# Patient Record
Sex: Female | Born: 1935 | Race: White | Hispanic: No | Marital: Single | Smoking: Never smoker
Health system: Southern US, Community
[De-identification: ages and names within clinical notes are randomized; demographics above are authoritative.]

## PROBLEM LIST (undated history)

## (undated) DIAGNOSIS — K7460 Unspecified cirrhosis of liver: Secondary | ICD-10-CM

## (undated) DIAGNOSIS — D649 Anemia, unspecified: Secondary | ICD-10-CM

## (undated) DIAGNOSIS — N2889 Other specified disorders of kidney and ureter: Secondary | ICD-10-CM

## (undated) DIAGNOSIS — I10 Essential (primary) hypertension: Secondary | ICD-10-CM

## (undated) DIAGNOSIS — I351 Nonrheumatic aortic (valve) insufficiency: Secondary | ICD-10-CM

## (undated) DIAGNOSIS — D689 Coagulation defect, unspecified: Secondary | ICD-10-CM

## (undated) DIAGNOSIS — K922 Gastrointestinal hemorrhage, unspecified: Secondary | ICD-10-CM

## (undated) DIAGNOSIS — Z79899 Other long term (current) drug therapy: Secondary | ICD-10-CM

## (undated) DIAGNOSIS — IMO0002 Reserved for concepts with insufficient information to code with codable children: Secondary | ICD-10-CM

## (undated) DIAGNOSIS — I701 Atherosclerosis of renal artery: Secondary | ICD-10-CM

## (undated) DIAGNOSIS — I350 Nonrheumatic aortic (valve) stenosis: Secondary | ICD-10-CM

## (undated) DIAGNOSIS — Z7409 Other reduced mobility: Secondary | ICD-10-CM

## (undated) DIAGNOSIS — T457X1A Poisoning by anticoagulant antagonists, vitamin K and other coagulants, accidental (unintentional), initial encounter: Secondary | ICD-10-CM

## (undated) DIAGNOSIS — N289 Disorder of kidney and ureter, unspecified: Secondary | ICD-10-CM

## (undated) DIAGNOSIS — I340 Nonrheumatic mitral (valve) insufficiency: Secondary | ICD-10-CM

## (undated) DIAGNOSIS — N280 Ischemia and infarction of kidney: Secondary | ICD-10-CM

## (undated) DIAGNOSIS — I4891 Unspecified atrial fibrillation: Secondary | ICD-10-CM

## (undated) DIAGNOSIS — I272 Pulmonary hypertension, unspecified: Secondary | ICD-10-CM

## (undated) DIAGNOSIS — K5790 Diverticulosis of intestine, part unspecified, without perforation or abscess without bleeding: Secondary | ICD-10-CM

## (undated) DIAGNOSIS — I2510 Atherosclerotic heart disease of native coronary artery without angina pectoris: Secondary | ICD-10-CM

## (undated) DIAGNOSIS — R9430 Abnormal result of cardiovascular function study, unspecified: Secondary | ICD-10-CM

## (undated) HISTORY — DX: Atherosclerosis of renal artery: I70.1

## (undated) HISTORY — DX: Unspecified atrial fibrillation: I48.91

## (undated) HISTORY — DX: Atherosclerotic heart disease of native coronary artery without angina pectoris: I25.10

## (undated) HISTORY — DX: Pulmonary hypertension, unspecified: I27.20

## (undated) HISTORY — DX: Nonrheumatic mitral (valve) insufficiency: I34.0

## (undated) HISTORY — DX: Gastrointestinal hemorrhage, unspecified: K92.2

## (undated) HISTORY — DX: Coagulation defect, unspecified: D68.9

## (undated) HISTORY — DX: Other specified disorders of kidney and ureter: N28.89

## (undated) HISTORY — PX: TOTAL ABDOMINAL HYSTERECTOMY: SHX209

## (undated) HISTORY — DX: Diverticulosis of intestine, part unspecified, without perforation or abscess without bleeding: K57.90

## (undated) HISTORY — DX: Ischemia and infarction of kidney: N28.0

## (undated) HISTORY — DX: Anemia, unspecified: D64.9

## (undated) HISTORY — DX: Nonrheumatic aortic (valve) stenosis: I35.0

## (undated) HISTORY — DX: Reserved for concepts with insufficient information to code with codable children: IMO0002

## (undated) HISTORY — DX: Essential (primary) hypertension: I10

## (undated) HISTORY — DX: Unspecified cirrhosis of liver: K74.60

## (undated) HISTORY — DX: Other reduced mobility: Z74.09

## (undated) HISTORY — DX: Disorder of kidney and ureter, unspecified: N28.9

## (undated) HISTORY — DX: Nonrheumatic aortic (valve) insufficiency: I35.1

## (undated) HISTORY — DX: Other long term (current) drug therapy: Z79.899

## (undated) HISTORY — DX: Abnormal result of cardiovascular function study, unspecified: R94.30

## (undated) HISTORY — PX: DILATION AND CURETTAGE OF UTERUS: SHX78

## (undated) HISTORY — DX: Poisoning by anticoagulant antagonists, vitamin k and other coagulants, accidental (unintentional), initial encounter: T45.7X1A

---

## 1997-06-30 ENCOUNTER — Other Ambulatory Visit: Admission: RE | Admit: 1997-06-30 | Discharge: 1997-06-30 | Payer: Self-pay | Admitting: Family Medicine

## 1999-04-20 ENCOUNTER — Other Ambulatory Visit: Admission: RE | Admit: 1999-04-20 | Discharge: 1999-04-20 | Payer: Self-pay | Admitting: *Deleted

## 1999-06-20 ENCOUNTER — Ambulatory Visit (HOSPITAL_COMMUNITY): Admission: RE | Admit: 1999-06-20 | Discharge: 1999-06-20 | Payer: Self-pay | Admitting: *Deleted

## 1999-06-20 ENCOUNTER — Encounter: Payer: Self-pay | Admitting: *Deleted

## 2002-02-12 ENCOUNTER — Emergency Department (HOSPITAL_COMMUNITY): Admission: EM | Admit: 2002-02-12 | Discharge: 2002-02-12 | Payer: Self-pay | Admitting: Emergency Medicine

## 2010-03-27 HISTORY — PX: CARDIAC CATHETERIZATION: SHX172

## 2010-04-01 ENCOUNTER — Inpatient Hospital Stay (HOSPITAL_COMMUNITY): Admission: EM | Admit: 2010-04-01 | Discharge: 2010-04-05 | Payer: Self-pay | Source: Home / Self Care

## 2010-04-01 LAB — COMPREHENSIVE METABOLIC PANEL
ALT: 11 U/L (ref 0–35)
AST: 18 U/L (ref 0–37)
Albumin: 3 g/dL — ABNORMAL LOW (ref 3.5–5.2)
Alkaline Phosphatase: 54 U/L (ref 39–117)
BUN: 32 mg/dL — ABNORMAL HIGH (ref 6–23)
CO2: 25 mEq/L (ref 19–32)
Calcium: 8.8 mg/dL (ref 8.4–10.5)
Chloride: 108 mEq/L (ref 96–112)
Creatinine, Ser: 1.72 mg/dL — ABNORMAL HIGH (ref 0.4–1.2)
GFR calc Af Amer: 34 mL/min — ABNORMAL LOW (ref >60)
GFR calc non Af Amer: 28 mL/min — ABNORMAL LOW (ref >60)
Glucose, Bld: 128 mg/dL — ABNORMAL HIGH (ref 70–99)
Potassium: 4.5 mEq/L (ref 3.5–5.1)
Sodium: 141 mEq/L (ref 135–145)
Total Bilirubin: 0.6 mg/dL (ref 0.3–1.2)
Total Protein: 5.4 g/dL — ABNORMAL LOW (ref 6.0–8.3)

## 2010-04-01 LAB — DIFFERENTIAL
Basophils Absolute: 0 10*3/uL (ref 0.0–0.1)
Basophils Relative: 0 % (ref 0–1)
Eosinophils Absolute: 0.2 10*3/uL (ref 0.0–0.7)
Eosinophils Relative: 2 % (ref 0–5)
Lymphocytes Relative: 7 % — ABNORMAL LOW (ref 12–46)
Lymphs Abs: 0.6 10*3/uL — ABNORMAL LOW (ref 0.7–4.0)
Monocytes Absolute: 0.5 10*3/uL (ref 0.1–1.0)
Monocytes Relative: 6 % (ref 3–12)
Neutro Abs: 7.2 10*3/uL (ref 1.7–7.7)
Neutrophils Relative %: 85 % — ABNORMAL HIGH (ref 43–77)

## 2010-04-01 LAB — URINE MICROSCOPIC-ADD ON

## 2010-04-01 LAB — URINALYSIS, ROUTINE W REFLEX MICROSCOPIC
Hemoglobin, Urine: NEGATIVE
Ketones, ur: 15 mg/dL — AB
Nitrite: NEGATIVE
Protein, ur: 100 mg/dL — AB
Specific Gravity, Urine: 1.022 (ref 1.005–1.030)
Urine Glucose, Fasting: NEGATIVE mg/dL
Urobilinogen, UA: 0.2 mg/dL (ref 0.0–1.0)
pH: 5 (ref 5.0–8.0)

## 2010-04-01 LAB — CBC
HCT: 29.3 % — ABNORMAL LOW (ref 36.0–46.0)
Hemoglobin: 10 g/dL — ABNORMAL LOW (ref 12.0–15.0)
MCH: 32.6 pg (ref 26.0–34.0)
MCHC: 34.1 g/dL (ref 30.0–36.0)
MCV: 95.4 fL (ref 78.0–100.0)
Platelets: 145 10*3/uL — ABNORMAL LOW (ref 150–400)
RBC: 3.07 MIL/uL — ABNORMAL LOW (ref 3.87–5.11)
RDW: 13.3 % (ref 11.5–15.5)
WBC: 8.5 10*3/uL (ref 4.0–10.5)

## 2010-04-01 LAB — CK TOTAL AND CKMB (NOT AT ARMC)
CK, MB: 2.6 ng/mL (ref 0.3–4.0)
Relative Index: INVALID (ref 0.0–2.5)
Total CK: 57 U/L (ref 7–177)

## 2010-04-01 LAB — LIPASE, BLOOD: Lipase: 26 U/L (ref 11–59)

## 2010-04-01 LAB — LACTIC ACID, PLASMA: Lactic Acid, Venous: 2 mmol/L (ref 0.5–2.2)

## 2010-04-01 LAB — GLUCOSE, CAPILLARY: Glucose-Capillary: 119 mg/dL — ABNORMAL HIGH (ref 70–99)

## 2010-04-01 LAB — TROPONIN I: Troponin I: 0.2 ng/mL — ABNORMAL HIGH (ref 0.00–0.06)

## 2010-04-01 LAB — PROCALCITONIN: Procalcitonin: <0.1 ng/mL

## 2010-04-02 ENCOUNTER — Encounter: Payer: Self-pay | Admitting: Cardiology

## 2010-04-11 LAB — BASIC METABOLIC PANEL
BUN: 28 mg/dL — ABNORMAL HIGH (ref 6–23)
BUN: 17 mg/dL (ref 6–23)
CO2: 22 mEq/L (ref 19–32)
CO2: 20 mEq/L (ref 19–32)
CO2: 21 mEq/L (ref 19–32)
Calcium: 8.6 mg/dL (ref 8.4–10.5)
Calcium: 8.5 mg/dL (ref 8.4–10.5)
Calcium: 8.6 mg/dL (ref 8.4–10.5)
Chloride: 112 mEq/L (ref 96–112)
Chloride: 112 mEq/L (ref 96–112)
Creatinine, Ser: 1.11 mg/dL (ref 0.4–1.2)
Creatinine, Ser: 1 mg/dL (ref 0.4–1.2)
Creatinine, Ser: 0.92 mg/dL (ref 0.4–1.2)
GFR calc Af Amer: 56 mL/min — ABNORMAL LOW (ref >60)
GFR calc Af Amer: >60 mL/min (ref >60)
GFR calc Af Amer: >60 mL/min (ref >60)
GFR calc non Af Amer: 46 mL/min — ABNORMAL LOW (ref >60)
Glucose, Bld: 109 mg/dL — ABNORMAL HIGH (ref 70–99)
Glucose, Bld: 111 mg/dL — ABNORMAL HIGH (ref 70–99)
Glucose, Bld: 115 mg/dL — ABNORMAL HIGH (ref 70–99)
Potassium: 3.9 mEq/L (ref 3.5–5.1)
Potassium: 3.7 mEq/L (ref 3.5–5.1)
Potassium: 3.9 mEq/L (ref 3.5–5.1)
Sodium: 140 mEq/L (ref 135–145)
Sodium: 139 mEq/L (ref 135–145)
Sodium: 138 mEq/L (ref 135–145)

## 2010-04-11 LAB — HEPARIN LEVEL (UNFRACTIONATED)
Heparin Unfractionated: 0.22 IU/mL — ABNORMAL LOW (ref 0.30–0.70)
Heparin Unfractionated: <0.1 IU/mL — ABNORMAL LOW (ref 0.30–0.70)
Heparin Unfractionated: <0.1 IU/mL — ABNORMAL LOW (ref 0.30–0.70)
Heparin Unfractionated: 0.38 IU/mL (ref 0.30–0.70)
Heparin Unfractionated: 0.5 IU/mL (ref 0.30–0.70)

## 2010-04-11 LAB — CBC
HCT: 25.2 % — ABNORMAL LOW (ref 36.0–46.0)
HCT: 25.6 % — ABNORMAL LOW (ref 36.0–46.0)
HCT: 26.1 % — ABNORMAL LOW (ref 36.0–46.0)
HCT: 27.9 % — ABNORMAL LOW (ref 36.0–46.0)
Hemoglobin: 8.7 g/dL — ABNORMAL LOW (ref 12.0–15.0)
Hemoglobin: 8.7 g/dL — ABNORMAL LOW (ref 12.0–15.0)
Hemoglobin: 9 g/dL — ABNORMAL LOW (ref 12.0–15.0)
Hemoglobin: 9.6 g/dL — ABNORMAL LOW (ref 12.0–15.0)
MCH: 32.6 pg (ref 26.0–34.0)
MCH: 32.2 pg (ref 26.0–34.0)
MCH: 32.3 pg (ref 26.0–34.0)
MCHC: 34.5 g/dL (ref 30.0–36.0)
MCHC: 34 g/dL (ref 30.0–36.0)
MCHC: 34.4 g/dL (ref 30.0–36.0)
MCHC: 34.5 g/dL (ref 30.0–36.0)
MCV: 94.4 fL (ref 78.0–100.0)
MCV: 94.8 fL (ref 78.0–100.0)
MCV: 93.5 fL (ref 78.0–100.0)
Platelets: 124 10*3/uL — ABNORMAL LOW (ref 150–400)
Platelets: 125 10*3/uL — ABNORMAL LOW (ref 150–400)
Platelets: 122 10*3/uL — ABNORMAL LOW (ref 150–400)
Platelets: 149 10*3/uL — ABNORMAL LOW (ref 150–400)
RBC: 2.67 MIL/uL — ABNORMAL LOW (ref 3.87–5.11)
RBC: 2.7 MIL/uL — ABNORMAL LOW (ref 3.87–5.11)
RBC: 2.79 MIL/uL — ABNORMAL LOW (ref 3.87–5.11)
RBC: 2.95 MIL/uL — ABNORMAL LOW (ref 3.87–5.11)
RDW: 13.2 % (ref 11.5–15.5)
RDW: 13.1 % (ref 11.5–15.5)
RDW: 12.9 % (ref 11.5–15.5)
WBC: 5.5 10*3/uL (ref 4.0–10.5)
WBC: 6.2 10*3/uL (ref 4.0–10.5)
WBC: 5.6 10*3/uL (ref 4.0–10.5)
WBC: 8.2 10*3/uL (ref 4.0–10.5)

## 2010-04-11 LAB — DIFFERENTIAL
Basophils Absolute: 0 10*3/uL (ref 0.0–0.1)
Basophils Relative: 1 % (ref 0–1)
Eosinophils Absolute: 0.5 10*3/uL (ref 0.0–0.7)
Eosinophils Relative: 9 % — ABNORMAL HIGH (ref 0–5)
Lymphocytes Relative: 13 % (ref 12–46)
Lymphs Abs: 0.8 10*3/uL (ref 0.7–4.0)
Monocytes Absolute: 0.6 10*3/uL (ref 0.1–1.0)
Monocytes Relative: 9 % (ref 3–12)
Neutro Abs: 4.3 10*3/uL (ref 1.7–7.7)
Neutrophils Relative %: 69 % (ref 43–77)

## 2010-04-11 LAB — CULTURE, BLOOD (ROUTINE X 2)
Culture  Setup Time: 201201062059
Culture  Setup Time: 201201062059
Culture: NO GROWTH
Culture: NO GROWTH

## 2010-04-11 LAB — CARDIAC PANEL(CRET KIN+CKTOT+MB+TROPI)
CK, MB: 5.6 ng/mL — ABNORMAL HIGH (ref 0.3–4.0)
CK, MB: 6.5 ng/mL (ref 0.3–4.0)
Relative Index: INVALID (ref 0.0–2.5)
Relative Index: INVALID (ref 0.0–2.5)
Total CK: 71 U/L (ref 7–177)
Total CK: 84 U/L (ref 7–177)
Troponin I: 0.88 ng/mL (ref 0.00–0.06)
Troponin I: 0.91 ng/mL (ref 0.00–0.06)

## 2010-04-11 LAB — PROTIME-INR
INR: 1.16 (ref 0.00–1.49)
Prothrombin Time: 15 seconds (ref 11.6–15.2)

## 2010-04-11 LAB — APTT: aPTT: 25 seconds (ref 24–37)

## 2010-04-11 LAB — TSH: TSH: 2.533 u[IU]/mL (ref 0.350–4.500)

## 2010-04-16 NOTE — Consult Note (Signed)
[name], [name] NO.:  [id number]  MEDICAL RECORD NO.:  [id number]          PATIENT TYPE:  INP  LOCATION:  4706                         FACILITY:  [facility]  PHYSICIAN:  Luis Abed, MD, FACCDATE OF BIRTH:  1918/09/19  DATE OF CONSULTATION:  04/01/2010 DATE OF DISCHARGE:                                CONSULTATION   PRIMARY CARE PHYSICIAN:  Dr. Brent Bulla in [place].  PRIMARY CARDIOLOGIST:  None.  CHIEF COMPLAINT:  Chest pain and abnormal cardiac enzymes.  HISTORY OF PRESENT ILLNESS:  [name] is an 77 year old female with no previous history of coronary artery disease.  She reports an approximately 5-day history of upper left chest pain.  She also had some general malaise and a mild cough.  She denies fevers or chills.  She had some nasal congestion as well.  The left-sided chest pain reached 8/10 at times.  It would come and go and seemed to be brought on or made worse by certain movements or lifting anything.  She felt that her chest wall was tender at one point on March 30, 2010.  She went to her family physician and received antibiotics for a possible upper respiratory infection.  He planned to see her again today.  This a.m., she woke up, but was unable to get out of bed because of weakness.  She was still having some chest pain.  Her chest wall was no longer tender.  Her family came over because they had been planning to take her to the doctor.  When they got there, they helped her to a wheelchair.  She was unable to walk unaided, so they called her physician, who recommended EMS transport to the hospital.  The patient does not remember being given any medications in route to the hospital.  Currently in the emergency room, she is resting comfortably on O2 and with IV fluids going.  She has never had symptoms like this before.  She is generally active and states she was moving boxes more so than usual this week, but not lifting anything that  was heavier than usual for her.  In the emergency room, her cardiac enzymes were checked and her troponin was elevated, so Cardiology was asked to evaluate her.  PAST MEDICAL HISTORY: 1. Hypertension. 2. Family history of coronary artery disease. 3. History of diverticulosis seen on CT remotely.  SURGICAL HISTORY:  She is status post hysterectomy, which is the only time she had been in the hospital.  ALLERGIES:  She is allergic or intolerant to PENICILLIN, NIACIN, and SULFA.  CURRENT MEDICATIONS: 1. Xanax 0.5 mg p.r.n. 2. Labetalol 400 mg a day. 3. Levaquin 500 mg a day since March 30, 2010. 4. Lisinopril/hydrochlorothiazide 20/12.5 daily. 5. Ambien 5 mg a day. 6. Vitamin D2 50,000 units a week. 7. Mobic 7.5 mg b.i.d.  SOCIAL HISTORY:  She lives in [place] alone.  She has some one that comes to help her and stays with her at times at night.  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  She has no history of alcohol, tobacco,  or drug abuse.  FAMILY HISTORY:  Her mother died at 42 with cancer, but not heart disease and her father died in his 27s with no heart disease, but one brother had a heart attack and died.  REVIEW OF SYSTEMS:  She has had nasal congestion, but no purulent discharge.  There has been no fevers or chills.  She had dyspnea on exertion, but denies orthopnea, PND, edema or palpitations.  She has not had shortness of breath until today.  The left chest pain is described as an aching.  The weakness is today.  She has some chronic arthralgias and joint pains.  Full 14-point review of systems is otherwise negative except as stated in the HPI.  PHYSICAL EXAMINATION:  VITAL SIGNS:  Temperature is 98.0, blood pressure initially 96/47, now 112/47, heart rate 91, respiratory rate 18, O2 saturation 98% on 3 liters. GENERAL:  She is a well-developed elderly white female in no acute distress. HEENT:  Normal. NECK:  There is no lymphadenopathy,  thyromegaly, bruits or JVD noted. CV:  Her heart is regular in rate and rhythm with an S1 and S2, and no significant murmur, rub, or gallop is noted.  Distal pulses are intact in all 4 extremities. LUNGS:  She has a few rales in the bases, but they are generally clear. SKIN:  No rashes or lesions are noted. ABDOMEN:  Soft and nontender with active bowel sounds. EXTREMITIES:  There is no cyanosis, clubbing or edema noted. MUSCULOSKELETAL:  There is no joint deformity or effusions, and no chest wall tenderness as well as no spine or CVA tenderness. NEUROLOGIC:  She is alert and oriented with cranial nerves II through XII grossly intact.  Chest x-ray:  No acute disease.  EKG:  Sinus rhythm, rate 81 with no acute ischemic changes.  Initial rhythm strip on EMS shows ST elevation in lead III (leads II, III, and aVF are the only one shown).  Her initial EKG by EMS showed a heart rate of 131 and is possibly atrial fibrillation.  In lead aVL, T-wave inversions are seen that are deeper and the T-wave inversions seen on the ER ECG which is clearly sinus.  LABORATORY VALUES:  Hemoglobin 10.0, hematocrit 29.3, WBCs 8.5, platelets 145.  Sodium 141, potassium 4.5, chloride 108, CO2 of 25, BUN 32, creatinine 1.72, glucose 128, lipase 26.  Urinalysis shows a small amount of leukocyte esterase and rare bacteria.  Lactic acid 2.0.  Pro calcitonin less than 10.  Fecal occult blood negative.  Troponin I of 0.20, CK-MB 57/2.6.  Initial point-of-care markers negative x1.  IMPRESSION:  [name] was seen today by Dr. Myrtis Ser, the patient evaluated and the data reviewed.  It is difficult to know exactly what the primary problem was.  There seemed to be pain with the movement of her left arm for 5 days.  Then, weakness did not develop until today.  No EKG was done until today, so we have no information on whether or not her rhythm was abnormal prior to today.  Currently, she feels well and her initial point of  care markers as well as her CK-MB were negative, but her troponin was elevated at 0.2.  PLAN: 1. Continue to cycle enzymes. 2. Check a 2-D echocardiogram. 3. If cardiac enzymes are consistent with injury or if her left     ventricle is abnormal, we will be aggressive with evaluation as she     is quite active.  If the cardiac enzymes do not indicate  injury, we     will follow her medically as long as her EF is okay.  Additionally,     she will be monitored closely for recurrence of atrial     fibrillation.  She has already been placed on heparin.     Consideration can be given to Coumadin, with the final decision to     be made once all data are in.  A beta-blocker will be added as her     blood pressure will allow, but currently, we will hold off.     Kimberly Demark, PA-C   ______________________________ Luis Abed, MD, [facility]    RB/MEDQ  D:  04/01/2010  T:  04/02/2010  Job:  [id number]  Electronically Signed by Kimberly Demark PA-C on 04/15/2010 04:30:09 PM Electronically Signed by Willa Rough MD FACC on 04/16/2010 09:10:26 AM

## 2010-04-17 NOTE — Procedures (Signed)
[name], [name] NO.:  [id number]  MEDICAL RECORD NO.:  [id number]          PATIENT TYPE:  INP  LOCATION:  6526                         FACILITY:  [facility]  PHYSICIAN:  [name], MDDATE OF BIRTH:  1918/05/17  DATE OF PROCEDURE:  04/04/2010 DATE OF DISCHARGE:                           CARDIAC CATHETERIZATION   PRIMARY CARE PHYSICIAN:  Dr. Brent [name] in [place].  PATIENT IDENTIFICATION:  [name] is a delightful 77 year old woman who was very functional, no history of coronary artery disease.  She was admitted with left shoulder pain and ended up ruling in for a non-ST elevation myocardial infarction.  She was also found to be in atrial fibrillation.  She is referred for cardiac catheterization.  PROCEDURES PERFORMED: 1. Selective coronary angiography. 2. Left heart catheterization. 3. Left ventriculogram.  DESCRIPTION OF PROCEDURE:  The risks and indications were explained. Informed consent was signed and placed on the chart.  A 5-French arterial sheath was placed in the right femoral artery.  Using modified Seldinger technique, standard catheters including JL-4, JR-4, and angled pigtail were used.  All catheter exchanges were made over wire.  There were no apparent complications.  Central aortic pressure 151/75 with a mean of 107.  LV pressure 149/10 with an EDP of 22.  There is no aortic stenosis.  Left main had just mild luminal irregularities, otherwise normal.  LAD was a moderate-sized vessel.  It gave off a tiny first diagonal and a small-to-moderate second diagonal.  There was a 30% lesion in the mid- LAD and 80% lesion in the mid second diagonal.  Left circumflex gave off a moderate-to-large ramus branch and a small OM- 1.  In the proximal AV groove circ, there was an 80% to 90% focal stenosis.  In the midportion of the ramus, there was an 80% to 90% focal stenosis.  The right coronary artery was a very large dominant vessel  probably about 5.0 mm in diameter.  It gave off a RV branch, a large PDA, and several large posterolaterals feeding most of the lateral wall. Proximal RCA had diffuse 30% stenosis.  In the PDA, there was diffuse 30% stenosis and there was focal 40% stenosis in the first posterolateral.  Left ventriculogram done in the RAO position.  EF was hard to discern due to ectopy, but ejection fraction was probably 60% to 65% with no regional wall motion abnormalities.  ASSESSMENT: 1. Coronary artery disease with high-grade lesions in the left     circumflex, ramus, and second diagonal branch. 2. Normal left ventricular function.  PLAN/DISCUSSION:  I reviewed the catheterization films with Dr. [place]. Given the fact that she has 3 lesions, which are all about the same severity, it is a bit hard to know which is the culprit.  We will proceed with percutaneous intervention on the left circumflex and ramus branch.  Given the size of the diagonal, we have opted to treat that medically for now.  We will see how she does.     Kimberly Buckles. Bensimhon, MD     [name]/MEDQ  D:  04/04/2010  T:  04/05/2010  Job:  [id number]  cc:   Kimberly Bulla, MD  Electronically Signed by Kimberly Meres MD on 04/17/2010 07:12:58 PM

## 2010-04-18 ENCOUNTER — Encounter: Payer: Self-pay | Admitting: Cardiology

## 2010-04-19 ENCOUNTER — Ambulatory Visit
Admission: RE | Admit: 2010-04-19 | Discharge: 2010-04-19 | Payer: Self-pay | Source: Home / Self Care | Attending: Cardiology | Admitting: Cardiology

## 2010-04-19 ENCOUNTER — Encounter: Payer: Self-pay | Admitting: Cardiology

## 2010-04-21 LAB — URINE CULTURE
Colony Count: 60000
Culture  Setup Time: 201201061740

## 2010-04-21 LAB — OCCULT BLOOD, POC DEVICE: Fecal Occult Bld: NEGATIVE

## 2010-04-21 NOTE — Discharge Summary (Addendum)
[name], [name] NO.:  [id number]  MEDICAL RECORD NO.:  [id number]          PATIENT TYPE:  INP  LOCATION:  6526                         FACILITY:  [facility]  PHYSICIAN:  [name], MDDATE OF BIRTH:  13-Dec-1918  DATE OF ADMISSION:  04/01/2010 DATE OF DISCHARGE:  04/05/2010                              DISCHARGE SUMMARY   PRIMARY CARDIOLOGIST:  Kimberly Abed, MD, [facility]  PRIMARY CARE PROVIDER:  Dr. Brent [name] in [place], [place].  DISCHARGE DIAGNOSIS:  Non-ST-segment elevation myocardial infarction.  SECONDARY DIAGNOSES: 1. Coronary artery disease status post successful percutaneous     coronary intervention and bare-metal stenting to the ramus     intermedius and proximal left circumflex this admission. 2. Newly-diagnosed atrial fibrillation, currently rate controlled. 3. Hypertension. 4. History of diverticulosis. 5. Status post hysterectomy.  ALLERGIES:  PENICILLIN, NIACIN, SULFA.  PROCEDURES:  A 2-D echocardiogram April 02, 2010, EF 60-65%, normal wall motion.  Moderate aortic stenosis and mild aortic regurgitation. Mild mitral regurgitation with severely calcified annulus.  Mildly dilated left atrium.  PASP of 67 mmHg.  Trivial pericardial effusion.  HISTORY OF PRESENT ILLNESS:  An 77 year old female without prior cardiac history presented to the [facility] ED on April 01, 2010, with a 5-day history of left upper chest discomfort with some chest wall tenderness. Pain was also sometimes reproducible with certain lifting movements.  On the morning of admission, the patient was severely weak and was unable to get out of bed and also complained of chest discomfort.  She was taken to the [facility] ED where she was found to be in atrial fibrillation initially at a rate of 131.  Followup ECG showed sinus rhythm.  The patient admitted for further evaluation.  HOSPITAL COURSE:  [name] ruled in for non-ST-segment elevation  MI eventually peaking her CK at 84, MB at 6.5, and troponin-I at 0.91.  She was maintained on beta-blocker therapy and was also heparinized. Decision was made that the patient would require a cardiac catheterization and she was agreeable.  She did have recurrence of atrial fibrillation starting on January 7 and this was subsequently persisted.  She has been rate controlled with IV diltiazem which we have converted to oral diltiazem today and she was also maintained on the oral beta-blocker therapy.  A 2-D echocardiogram performed this admission showed normal LV function with moderate aortic stenosis and mild aortic insufficiency.  She also had mild mitral regurgitation.  Followup echo was recommended with plan for conservative management.  The patient finally underwent diagnostic catheterization on January 9 revealing an 80-90% stenosis in the distal ramus intermedius as well as an 80-90% stenosis in the proximal left circumflex.  She also had an 80% stenosis in small second diagonal and otherwise nonobstructive disease. EF was 60-65%.  Films were reviewed and was felt that she would benefit from PCI of both the circumflex and ramus intermedius.  This was carried out on the afternoon of January 9 with placement of a 2.5 x 12-mm mini Vision bare-metal stent in the ramus intermedius along with a 2.5 x 12- mm  mini Vision bare metal stent in the left circumflex.  The patient tolerated this procedure well and postprocedure, she has been ambulating without difficulty.  She has remained in atrial fibrillation and we have converted her from IV to p.o. diltiazem with heart rates current in the 80 range.  The patient is felt to be ready for discharge today.  Of note, she had an urinalysis on admission which showed 0-2 wbcs, rare bacteria, small leukocytes and negative nitrite.  Urine culture showed enterococcus species as well as coag negative Staphylococcus species. The patient has been  asymptomatic as well as afebrile with normal white blood count throughout admission.  We curb sided infectious disease and they felt that growth of both organisms were likely contaminant and therefore the patient is not going to be treated with antibiotics.  DISCHARGE LABORATORY FINDINGS:  Hemoglobin 9.0, hematocrit 26.1, WBC 8.2, platelets 149.  Sodium 138, potassium 3.9, chloride 112, CO2 21, BUN 17, creatinine 0.92, glucose 115, total bilirubin 0.6, alkaline phosphatase 54, AST 18, ALT 11, total protein 5.4, albumin 3.0, calcium 8.6, procalcitonin less than 0.10, lactic acid 2.0, lipase 26, CK 84, MB 6.5, troponin-I 0.91.  TSH 2.533.  Fecal occult blood was negative.  DISPOSITION:  The patient will be discharged home today in good condition.  FOLLOWUP PLANS AND APPOINTMENTS:  We will arrange to follow up with [name] on January 24, at 11:00 a.m.  Follow up with [name] as previously scheduled.  DISCHARGE MEDICATIONS: 1. Aspirin 325 mg daily. 2. Plavix 75 mg daily. 3. Diltiazem CD 180 mg daily. 4. Lipitor 10 mg nightly. 5. Metoprolol 50 mg b.i.d. 6. Nitroglycerin 0.4 subcu p.r.n. chest pain. 7. Alprazolam 0.5 mg 1 tablet t.i.d. p.r.n. 8. Vitamin D 50,000 units every week. 9. Zolpidem 5 mg 1 tablet nightly p.r.n.  Of note, we have not initiated Coumadin therapy at this time given acute coronary artery syndrome with requirement for aspirin and Plavix in the setting of advanced age of 42.  That said, we will reconsider Coumadin therapy after at least 1 month of Plavix and also consider cardioversion if we cannot adequately rate control atrial fibrillation or patient experiences symptoms in the future.  OUTSTANDING LABORATORY STUDIES:  The patient will require followup lipids and LFTs in approximately 6-8 weeks given new statin therapy.  DURATION DISCHARGE ENCOUNTER:  60 minutes including physician time.     [name],  ANP   ______________________________ Kimberly Buckles. Bensimhon, MD    CB/MEDQ  D:  04/05/2010  T:  04/06/2010  Job:  [id number]  cc:   Brent [name]  Electronically Signed by [name] ANP on 04/18/2010 03:56:57 PM Electronically Signed by Arvilla Meres MD on 04/21/2010 04:03:46 PM

## 2010-04-22 ENCOUNTER — Ambulatory Visit: Admit: 2010-04-22 | Payer: Self-pay

## 2010-04-23 ENCOUNTER — Inpatient Hospital Stay (HOSPITAL_COMMUNITY)
Admission: EM | Admit: 2010-04-23 | Discharge: 2010-04-29 | DRG: 699 | Disposition: A | Discharge status: Home / Self Care | Payer: Medicare Other | Attending: Cardiology | Admitting: Cardiology

## 2010-04-23 DIAGNOSIS — N2889 Other specified disorders of kidney and ureter: Principal | ICD-10-CM | POA: Diagnosis present

## 2010-04-23 DIAGNOSIS — I359 Nonrheumatic aortic valve disorder, unspecified: Secondary | ICD-10-CM | POA: Diagnosis present

## 2010-04-23 DIAGNOSIS — I214 Non-ST elevation (NSTEMI) myocardial infarction: Secondary | ICD-10-CM | POA: Diagnosis present

## 2010-04-23 DIAGNOSIS — K7460 Unspecified cirrhosis of liver: Secondary | ICD-10-CM | POA: Diagnosis present

## 2010-04-23 DIAGNOSIS — I509 Heart failure, unspecified: Secondary | ICD-10-CM | POA: Diagnosis present

## 2010-04-23 DIAGNOSIS — I2510 Atherosclerotic heart disease of native coronary artery without angina pectoris: Secondary | ICD-10-CM | POA: Diagnosis present

## 2010-04-23 DIAGNOSIS — I319 Disease of pericardium, unspecified: Secondary | ICD-10-CM | POA: Diagnosis present

## 2010-04-23 DIAGNOSIS — N289 Disorder of kidney and ureter, unspecified: Secondary | ICD-10-CM | POA: Diagnosis present

## 2010-04-23 DIAGNOSIS — I701 Atherosclerosis of renal artery: Secondary | ICD-10-CM | POA: Diagnosis present

## 2010-04-23 DIAGNOSIS — I5030 Unspecified diastolic (congestive) heart failure: Secondary | ICD-10-CM | POA: Diagnosis present

## 2010-04-23 DIAGNOSIS — I4891 Unspecified atrial fibrillation: Secondary | ICD-10-CM | POA: Diagnosis present

## 2010-04-23 DIAGNOSIS — D649 Anemia, unspecified: Secondary | ICD-10-CM | POA: Diagnosis present

## 2010-04-23 DIAGNOSIS — I2789 Other specified pulmonary heart diseases: Secondary | ICD-10-CM | POA: Diagnosis present

## 2010-04-23 DIAGNOSIS — I10 Essential (primary) hypertension: Secondary | ICD-10-CM | POA: Diagnosis present

## 2010-04-23 DIAGNOSIS — I259 Chronic ischemic heart disease, unspecified: Secondary | ICD-10-CM | POA: Diagnosis present

## 2010-04-23 DIAGNOSIS — I059 Rheumatic mitral valve disease, unspecified: Secondary | ICD-10-CM | POA: Diagnosis present

## 2010-04-23 DIAGNOSIS — J90 Pleural effusion, not elsewhere classified: Secondary | ICD-10-CM | POA: Diagnosis present

## 2010-04-23 LAB — COMPREHENSIVE METABOLIC PANEL
ALT: 27 U/L (ref 0–35)
AST: 54 U/L — ABNORMAL HIGH (ref 0–37)
Albumin: 3.7 g/dL (ref 3.5–5.2)
Alkaline Phosphatase: 65 U/L (ref 39–117)
BUN: 21 mg/dL (ref 6–23)
CO2: 23 mEq/L (ref 19–32)
Calcium: 8.7 mg/dL (ref 8.4–10.5)
Chloride: 105 mEq/L (ref 96–112)
Creatinine, Ser: 1.39 mg/dL — ABNORMAL HIGH (ref 0.4–1.2)
GFR calc Af Amer: 43 mL/min — ABNORMAL LOW (ref >60)
GFR calc non Af Amer: 36 mL/min — ABNORMAL LOW (ref >60)
Glucose, Bld: 103 mg/dL — ABNORMAL HIGH (ref 70–99)
Potassium: 4.2 mEq/L (ref 3.5–5.1)
Sodium: 134 mEq/L — ABNORMAL LOW (ref 135–145)
Total Bilirubin: 0.7 mg/dL (ref 0.3–1.2)
Total Protein: 6.2 g/dL (ref 6.0–8.3)

## 2010-04-23 LAB — URINALYSIS, ROUTINE W REFLEX MICROSCOPIC
Bilirubin Urine: NEGATIVE
Hgb urine dipstick: NEGATIVE
Ketones, ur: NEGATIVE mg/dL
Leukocytes, UA: NEGATIVE
Nitrite: NEGATIVE
Protein, ur: 30 mg/dL — AB
Specific Gravity, Urine: 1.017 (ref 1.005–1.030)
Urine Glucose, Fasting: NEGATIVE mg/dL
Urobilinogen, UA: 0.2 mg/dL (ref 0.0–1.0)
pH: 6.5 (ref 5.0–8.0)

## 2010-04-23 LAB — POCT CARDIAC MARKERS
CKMB, poc: 1 ng/mL (ref 1.0–8.0)
Myoglobin, poc: 97.6 ng/mL (ref 12–200)
Troponin i, poc: <0.05 ng/mL (ref 0.00–0.09)

## 2010-04-23 LAB — CBC
HCT: 30.2 % — ABNORMAL LOW (ref 36.0–46.0)
Hemoglobin: 10.1 g/dL — ABNORMAL LOW (ref 12.0–15.0)
MCH: 31.6 pg (ref 26.0–34.0)
MCHC: 33.4 g/dL (ref 30.0–36.0)
MCV: 94.4 fL (ref 78.0–100.0)
Platelets: 146 10*3/uL — ABNORMAL LOW (ref 150–400)
RBC: 3.2 MIL/uL — ABNORMAL LOW (ref 3.87–5.11)
RDW: 14.9 % (ref 11.5–15.5)
WBC: 10.2 10*3/uL (ref 4.0–10.5)

## 2010-04-23 LAB — URINE MICROSCOPIC-ADD ON

## 2010-04-23 LAB — PROTIME-INR
INR: 1.36 (ref 0.00–1.49)
Prothrombin Time: 17 seconds — ABNORMAL HIGH (ref 11.6–15.2)

## 2010-04-23 LAB — BRAIN NATRIURETIC PEPTIDE: Pro B Natriuretic peptide (BNP): 942 pg/mL — ABNORMAL HIGH (ref 0.0–100.0)

## 2010-04-23 LAB — DIFFERENTIAL
Basophils Absolute: 0 10*3/uL (ref 0.0–0.1)
Basophils Relative: 0 % (ref 0–1)
Eosinophils Absolute: 0.5 10*3/uL (ref 0.0–0.7)
Eosinophils Relative: 4 % (ref 0–5)
Lymphocytes Relative: 7 % — ABNORMAL LOW (ref 12–46)
Lymphs Abs: 0.7 10*3/uL (ref 0.7–4.0)
Monocytes Absolute: 0.7 10*3/uL (ref 0.1–1.0)
Monocytes Relative: 7 % (ref 3–12)
Neutro Abs: 8.3 10*3/uL — ABNORMAL HIGH (ref 1.7–7.7)
Neutrophils Relative %: 82 % — ABNORMAL HIGH (ref 43–77)

## 2010-04-23 LAB — LIPASE, BLOOD: Lipase: 41 U/L (ref 11–59)

## 2010-04-23 LAB — D-DIMER, QUANTITATIVE: D-Dimer, Quant: 1.88 ug/mL-FEU — ABNORMAL HIGH (ref 0.00–0.48)

## 2010-04-23 LAB — APTT: aPTT: 37 seconds (ref 24–37)

## 2010-04-24 LAB — CARDIAC PANEL(CRET KIN+CKTOT+MB+TROPI)
CK, MB: 1.7 ng/mL (ref 0.3–4.0)
Relative Index: INVALID (ref 0.0–2.5)
Total CK: 88 U/L (ref 7–177)
Troponin I: 0.02 ng/mL (ref 0.00–0.06)

## 2010-04-24 LAB — COMPREHENSIVE METABOLIC PANEL
ALT: 42 U/L — ABNORMAL HIGH (ref 0–35)
AST: 50 U/L — ABNORMAL HIGH (ref 0–37)
Albumin: 3 g/dL — ABNORMAL LOW (ref 3.5–5.2)
Alkaline Phosphatase: 50 U/L (ref 39–117)
BUN: 18 mg/dL (ref 6–23)
CO2: 22 mEq/L (ref 19–32)
Calcium: 8.5 mg/dL (ref 8.4–10.5)
Chloride: 102 mEq/L (ref 96–112)
Creatinine, Ser: 1.64 mg/dL — ABNORMAL HIGH (ref 0.4–1.2)
GFR calc Af Amer: 36 mL/min — ABNORMAL LOW (ref >60)
GFR calc non Af Amer: 30 mL/min — ABNORMAL LOW (ref >60)
Glucose, Bld: 112 mg/dL — ABNORMAL HIGH (ref 70–99)
Potassium: 4.2 mEq/L (ref 3.5–5.1)
Sodium: 132 mEq/L — ABNORMAL LOW (ref 135–145)
Total Bilirubin: 0.8 mg/dL (ref 0.3–1.2)
Total Protein: 5 g/dL — ABNORMAL LOW (ref 6.0–8.3)

## 2010-04-24 LAB — CBC
HCT: 27.4 % — ABNORMAL LOW (ref 36.0–46.0)
Hemoglobin: 9.5 g/dL — ABNORMAL LOW (ref 12.0–15.0)
MCH: 32.3 pg (ref 26.0–34.0)
MCHC: 34.7 g/dL (ref 30.0–36.0)
MCV: 93.2 fL (ref 78.0–100.0)
Platelets: 132 10*3/uL — ABNORMAL LOW (ref 150–400)
RBC: 2.94 MIL/uL — ABNORMAL LOW (ref 3.87–5.11)
RDW: 15 % (ref 11.5–15.5)
WBC: 11.6 10*3/uL — ABNORMAL HIGH (ref 4.0–10.5)

## 2010-04-24 LAB — HEPARIN LEVEL (UNFRACTIONATED): Heparin Unfractionated: 0.39 IU/mL (ref 0.30–0.70)

## 2010-04-25 LAB — CBC
MCH: 32 pg (ref 26.0–34.0)
MCHC: 34.3 g/dL (ref 30.0–36.0)
Platelets: 118 10*3/uL — ABNORMAL LOW (ref 150–400)
RDW: 15.2 % (ref 11.5–15.5)

## 2010-04-25 LAB — HEPARIN LEVEL (UNFRACTIONATED)
Heparin Unfractionated: 0.1 IU/mL — ABNORMAL LOW (ref 0.30–0.70)
Heparin Unfractionated: 1.14 IU/mL — ABNORMAL HIGH (ref 0.30–0.70)
Heparin Unfractionated: 0.51 IU/mL (ref 0.30–0.70)

## 2010-04-25 LAB — BASIC METABOLIC PANEL
Calcium: 8.4 mg/dL (ref 8.4–10.5)
Creatinine, Ser: 1.69 mg/dL — ABNORMAL HIGH (ref 0.4–1.2)
GFR calc Af Amer: 34 mL/min — ABNORMAL LOW (ref >60)
GFR calc non Af Amer: 28 mL/min — ABNORMAL LOW (ref >60)

## 2010-04-25 LAB — PROTIME-INR
INR: 1.32 (ref 0.00–1.49)
Prothrombin Time: 16.6 seconds — ABNORMAL HIGH (ref 11.6–15.2)

## 2010-04-26 LAB — CBC
HCT: 26.3 % — ABNORMAL LOW (ref 36.0–46.0)
MCHC: 33.5 g/dL (ref 30.0–36.0)
Platelets: 114 10*3/uL — ABNORMAL LOW (ref 150–400)
RDW: 15 % (ref 11.5–15.5)

## 2010-04-26 LAB — BASIC METABOLIC PANEL
Calcium: 8.7 mg/dL (ref 8.4–10.5)
GFR calc Af Amer: 32 mL/min — ABNORMAL LOW (ref >60)
GFR calc non Af Amer: 27 mL/min — ABNORMAL LOW (ref >60)
Glucose, Bld: 104 mg/dL — ABNORMAL HIGH (ref 70–99)
Sodium: 138 mEq/L (ref 135–145)

## 2010-04-26 LAB — HEPARIN LEVEL (UNFRACTIONATED): Heparin Unfractionated: 0.55 IU/mL (ref 0.30–0.70)

## 2010-04-26 LAB — PROTIME-INR: INR: 1.46 (ref 0.00–1.49)

## 2010-04-27 ENCOUNTER — Inpatient Hospital Stay (HOSPITAL_COMMUNITY): Payer: Medicare Other

## 2010-04-27 DIAGNOSIS — I4891 Unspecified atrial fibrillation: Secondary | ICD-10-CM

## 2010-04-27 LAB — BASIC METABOLIC PANEL
BUN: 24 mg/dL — ABNORMAL HIGH (ref 6–23)
CO2: 23 mEq/L (ref 19–32)
Chloride: 107 mEq/L (ref 96–112)
Glucose, Bld: 103 mg/dL — ABNORMAL HIGH (ref 70–99)
Potassium: 4.4 mEq/L (ref 3.5–5.1)
Sodium: 138 mEq/L (ref 135–145)

## 2010-04-27 LAB — CBC
HCT: 27.4 % — ABNORMAL LOW (ref 36.0–46.0)
MCHC: 33.9 g/dL (ref 30.0–36.0)
MCV: 92.9 fL (ref 78.0–100.0)
RDW: 14.8 % (ref 11.5–15.5)
WBC: 6.1 10*3/uL (ref 4.0–10.5)

## 2010-04-27 LAB — PROTIME-INR: Prothrombin Time: 22.4 seconds — ABNORMAL HIGH (ref 11.6–15.2)

## 2010-04-27 NOTE — H&P (Signed)
[name], [name] NO.:  [id number]  MEDICAL RECORD NO.:  [id number]          PATIENT TYPE:  INP  LOCATION:  3711                         FACILITY:  [facility]  PHYSICIAN:  Cassell Clement, M.D. DATE OF BIRTH:  [birth year]  DATE OF ADMISSION:  04/23/2010 DATE OF DISCHARGE:                             HISTORY & PHYSICAL   PRIMARY CARDIOLOGIST:  Luis Abed, MD, [facility]  CHIEF COMPLAINT:  Pain in right flank for 1 week.  HISTORY:  This is a delightful 77 year old Caucasian female who was recently hospitalized at [facility] from April 01, 2010, through April 05, 2010 for a non-ST-elevation myocardial infarction. During that admission, she was found to be in new atrial fibrillation. She had significant elevation of her cardiac enzymes and underwent cardiac catheterization on April 04, 2010, which showed significant lesions in the ramus intermedius as well as the proximal left circumflex.  Dr. Peter Swaziland inserted bare-metal stents into both artery successfully.  The patient was discharged the following day on aspirin and Plavix.  She was in atrial fibrillation at time of discharge and the plan was that after being on aspirin and Plavix for 46 weeks, that the Plavix could be stopped and she could be switched at that time to Coumadin if her atrial fibrillation persisted.  During that hospitalization, the patient did have an echocardiogram on April 02, 2010, which showed mild concentric hypertrophy and her left ventricular ejection fraction was normal at 60-65%.  She had normal wall motion. There were no regional wall motion abnormalities.  She did have evidence of high left ventricular filling pressures by Doppler.  She had moderate aortic stenosis with mild aortic insufficiency and she had a severely calcified mitral annulus with mild mitral regurgitation.  Her left atrium was moderately dilated.  She had pulmonary artery hypertension with a PA peak  pressure of 67.  She had a trivial pericardial effusion.  The patient was discharged and initially did well, but about a week ago, she began experiencing nagging pain in the right flank.  The pain would be intermittent and was not constant.  Because of persistence of the pain, the patient came to the emergency room where her initial evaluation led to a CT scan of the abdomen with contrast, which demonstrated that the patient has a thrombus in the main portion of the right renal artery with resultant hypoperfusion of most of the right kidney.  The patient's renal function at this time appears to be reasonably normal with a BUN of 21, a creatinine of 1.39.  She has mild elevation of her SGOT.  Her lipase is normal.  Her white count is normal at 10,000 with hemoglobin of 10.1.  Cardiac enzymes are normal.  Her urinalysis is unremarkable and does not show any evidence of hematuria.  Her chest x- ray shows improved aeration of the left lung base compared to the most recent chest x-ray and there is no pulmonary edema, although the heart remains slightly enlarged.  PAST MEDICAL HISTORY:  History of hypertension.  FAMILY HISTORY:  Positive for coronary artery disease.  PAST SURGICAL HISTORY:  She is status post hysterectomy.  ALLERGIES:  The patient is allergic to PENICILLIN, NIACIN, and SULFA.  PRESENT MEDICATIONS: 1. Lopressor 50 mg b.i.d. 2. Plavix 75 mg daily. 3. Aspirin 325 mg daily. 4. Metoprolol 50 mg twice a day. 5. Sublingual nitroglycerin p.r.n. 6. Meloxicam 7.5 mg twice a day. 7. Lipitor 10 mg at bedtime. 8. Vitamin D 50,000 units weekly on Wednesdays. 9. Zolpidem 5 mg at bedtime. 10.Diltiazem CD 180 mg daily.  REVIEW OF SYSTEMS:  The patient denies any fever or chills.  She has not been aware of any unusual bleeding from the Plavix or aspirin. Specifically, no epistaxis, gross hematuria, or hematochezia.  All other systems negative in detail.  PHYSICAL EXAMINATION:   VITAL SIGNS:  Her blood pressure is 140/86, her pulse is 116 and irregularly irregular, respirations are 20, oxygen saturation is 95% on 2 L a minute. GENERAL APPEARANCE:  An elderly woman who is alert and oriented and in no acute distress. HEENT:  Head And Neck:  Pupils equal and reactive.  Sclerae nonicteric. Extraocular movements are full.  Mouth and pharynx normal.  Jugular venous pressure normal.  Carotids normal.  Thyroid normal.  No lymphadenopathy. CHEST:  Good breath sounds bilaterally without rales or rhonchi. HEART:  A soft systolic ejection murmur at the base.  There is no gallop or rub. ABDOMEN:  Mildly and diffusely tender without rebound.  The patient is also tender in the right flank.  Bowel sounds are present. EXTREMITIES:  No phlebitis or edema.  Pedal pulses are present. NEUROLOGIC:  Physiologic.  Her 12-lead EKG is pending.  The telemetry monitor shows atrial fibrillation with rapid ventricular response.  PERTINENT LABORATORY WORK:  Sodium 134, potassium 4.2, BUN 21, creatinine 1.39.  SGOT is 54.  White count is normal and hemoglobin is 10.  IMPRESSION: 1. Ischemic heart disease with recent non-stenting, treated with 2     bare-metal stents, one to the ramus intermedius branch and one to     the left circumflex. 2. Recently discovered atrial fibrillation, noted on the admission on     June 6. 3. One-week history of right flank pain with the finding of thrombus     in the right renal artery on CT scan today with resultant     diminished perfusion of the right kidney, suspect possibly embolics     in the left atrium.  DISPOSITION:  The patient is being admitted to the service of Dr. Jerral Bonito to telemetry.  We will stop aspirin, but continue Plavix and we will add IV heparin for atrial fibrillation.  We will follow renal function closely.  We will use beta-blocker and Cardizem for rate control.  We will give pain control with Tylenol or Vicodin.           ______________________________ Cassell Clement, M.D.     TB/MEDQ  D:  04/23/2010  T:  04/24/2010  Job:  [id number]  cc:   Luis Abed, MD, [facility] Brent Bulla  Electronically Signed by Cassell Clement M.D. on 04/27/2010 12:57:07 PM

## 2010-04-28 LAB — BASIC METABOLIC PANEL
BUN: 22 mg/dL (ref 6–23)
Chloride: 106 mEq/L (ref 96–112)
Glucose, Bld: 104 mg/dL — ABNORMAL HIGH (ref 70–99)
Potassium: 4.6 mEq/L (ref 3.5–5.1)

## 2010-04-28 LAB — CBC
HCT: 27.6 % — ABNORMAL LOW (ref 36.0–46.0)
MCHC: 33.3 g/dL (ref 30.0–36.0)
MCV: 93.6 fL (ref 78.0–100.0)
RDW: 14.9 % (ref 11.5–15.5)

## 2010-04-28 NOTE — Assessment & Plan Note (Signed)
Summary: eph. gd   Visit Type:  post hospital visit Primary Provider:  Brent Bulla, MD  CC:  CAD and atrial fibrillation.  History of Present Illness: The patient is seen post hospitalization.  She was to our cardiology team when she presented to the hospital on April 01, 2010 with a non-STEMI.  Catheterization on April 04, 2010 revealed coronary disease.  She received a bare-metal stent to the ramus and a bare-metal stent to the circumflex.  There is a residual 80% stenosis of a small second diagonal that is to be followed medically.  She had mild mitral regurgitation and mild aortic insufficiency.  Ejection fraction was 60%.  In the hospital she had a new diagnosis of atrial fibrillation.  She actually went in and out of atrial fib in the hospital.  He ultimately was to use Plavix for one month for her bare-metal stent and then switch her to Coumadin for her atrial fibrillation.  Cardioversion can be considered in the future.  She's here today feeling well.  She is very concerned about the cost of her Plavix.  She can  afford enough to take it through the end of the month. She's not having chest pain or shortness of breath.  As part of the evaluation today I have reviewed the hospital H&P and discharge summary.  I reviewed the echo report in the catheterization report.  Preventive Screening-Counseling & Management  Alcohol-Tobacco     Smoking Status: never  Caffeine-Diet-Exercise     Does Patient Exercise: yes      Drug Use:  no.    Current Medications (verified): 1)  Vitamin D2 1.25mg  (50000 Units) .... Take 1 Cap Every Week 2)  Alprazolam 0.5 Mg Tabs (Alprazolam) .... Take 1 Tablet By Mouth Three Times A Day As Needed 3)  Atorvastatin Calcium 10 Mg Tabs (Atorvastatin Calcium) .... Take 1 Tablet By Mouth Once A Day 4)  Metoprolol Tartrate 50 Mg Tabs (Metoprolol Tartrate) .... Take One Tablet By Mouth Twice A Day 5)  Ambien 5 Mg Tabs (Zolpidem Tartrate) .... At Bedtime As  Needed 6)  Plavix 75 Mg Tabs (Clopidogrel Bisulfate) .... Take One Tablet By Mouth Daily 7)  Meloxicam 7.5 Mg Tabs (Meloxicam) .... Take 1 Tablet By Mouth Two Times A Day With Food 8)  Diltiazem Hcl Er Beads 180 Mg Xr24h-Cap (Diltiazem Hcl Er Beads) .... Take 1 Capsule By Mouth Once A Day 9)  Aspirin Ec 325 Mg Tbec (Aspirin) .... Take One Tablet By Mouth Daily 10)  Nitrostat 0.4 Mg Subl (Nitroglycerin) .Marland Kitchen.. 1 Tablet Under Tongue At Onset of Chest Pain; You May Repeat Every 5 Minutes For Up To 3 Doses.  Past History:  Past Medical History: CAD    non-STEMI.. April 01, 2010, catheterization January 9, bare-metal stent to the ramus, bare-metal stent to the circumflex... also 80% stenosis small second diagonal to be followed Atrial fibrillation   new diagnoses... hospital... January, 2012... Coumadin not used with aspirin and Plavix and 77 years of age, to be reconsidered after one month,, and consideration of cardioversion Aortic stenosis   moderate... echo... January, 2012 Aortic insufficiency... mild... echo... January, 2012 LV   normal function... echo.. January, 2012... EF 60% Mitral regurgitation... mild.... echo.... January, 2012...severe annular calcification Hypertension Diverticulosis Pulmonary hypertension     67 mmHg... echo... January, 2012 .Marland Kitchen  Past Surgical History: Abdominal Hysterectomy-Total Stents D&C  Family History: Family History of Hypertension:   Social History: Retired  Widowed  Tobacco Use - No.  Alcohol  Use - no Regular Exercise - yes Drug Use - no Does Patient Exercise:  yes Smoking Status:  never Drug Use:  no  Review of Systems       Patient denies fever, chills, headache, sweats, rash, change in vision, change in hearing, chest pain, cough, nausea vomiting, urinary symptoms.  All of the systems are reviewed and are negative.  Vital Signs:  Patient profile:   77 year old female Height:      63 inches Weight:      159 pounds BMI:      28.27 Pulse rate:   88 / minute BP sitting:   152 / 80  (left arm) Cuff size:   regular  Vitals Entered By: Hardin Negus, RMA (April 19, 2010 11:30 AM)  Physical Exam  General:  patient was anxious today but she is quite stable.  She is overweight. Head:  head is atraumatic. Eyes:  no xanthelasma Neck:  no jugular venous extension. Chest Wall:  no chest wall tenderness. Lungs:  lungs are clear.  Respiratory effort is nonlabored. Heart:  cardiac exam reveals an S1-S2.  The rhythm is irregularly irregular. There is a crescendo decrescendo systolic murmur. Abdomen:  abdomen soft. Msk:  no musculoskeletal deformities. Extremities:  no peripheral edema. Skin:  are no skin rashes. Psych:  patient is oriented to person time and place.  Affect is normal.  She is here with a caretaker.   Impression & Recommendations:  Problem # 1:  PULMONARY HYPERTENSION (ICD-416.8) The patient has pulmonary hypertension.  No further workup.  Problem # 2:  HYPERTENSION (ICD-401.9) Blood pressure today is 150/80.  I've chosen not to change her medicines as of today.  This will be followed carefully.  Problem # 3:  MITRAL REGURGITATION (ICD-396.3) Patient has mild mitral regurgitation.  There is severe annular calcification.  This can be followed.  Problem # 4:  AORTIC INSUFFICIENCY (ICD-424.1) The patient has mild aortic insufficiency and moderate aortic stenosis.  These will be followed.  Problem # 5:  ATRIAL FIBRILLATION, HX OF (ICD-V12.59) Patient has atrial fibrillation.  EKG is done today and reviewed by me.  She has nonspecific ST-T wave changes.  There is atrial fibrillation with a controlled rate.  She is on aspirin and Plavix for one month after her 2 bare-metal stents.  We will then change her carefully to low-dose aspirin with Coumadin very tightly controlled in the range of 2.0.  I will decide over time if and when we can stop her aspirin I will also decide over time if she is a candidate  for cardioversion.  Problem # 6:  CAD (ICD-414.00) Coronary status is stable.  She is several weeks post 2 bare-metal stents.  I am hopeful that one month with Plavix will be adequate.  There is too much risk to use Coumadin Plavix and aspirin in this patient.  I will see her for followup in 6 weeks.  Other Orders: EKG w/ Interpretation (93000)  Patient Instructions: 1)  For a cold you may take plain meds NO decongestants, allegra, claritin or zyrtec are all fine 2)  Call me when you are down to about 5 days left on your Plavix and we can discuss changing you over to coumadin 3)  Follow up in 6 weeks

## 2010-04-28 NOTE — Miscellaneous (Signed)
  Clinical Lists Changes  Problems: Added new problem of CAD (ICD-414.00) Added new problem of ATRIAL FIBRILLATION, HX OF (ICD-V12.59) Added new problem of AORTIC STENOSIS (ICD-424.1) Added new problem of AORTIC INSUFFICIENCY (ICD-424.1) Added new problem of MITRAL REGURGITATION (ICD-396.3) Added new problem of HYPERTENSION (ICD-401.9) Added new problem of PULMONARY HYPERTENSION (ICD-416.8) Observations: Added new observation of PAST MED HX: CAD    non-STEMI.. April 01, 2010, catheterization January 9, bare-metal stent to the ramus, bare-metal stent to the circumflex... also 80% stenosis small second diagonal to be followed Atrial fibrillation   new diagnoses... hospital... January, 2012... Coumadin not used with aspirin and Plavix and 77 years of age, to be reconsidered after one month,, and consideration of cardioversion Aortic stenosis   moderate... echo... January, 2012 Aortic insufficiency... mild... echo... January, 2012 LV   normal function... echo.. January, 2012... EF 60% Mitral regurgitation... mild.... echo.... January, 2012...severe annular calcification Hypertension Diverticulosis Pulmonary hypertension     67 mmHg... echo... January, 2012  (04/18/2010 16:19) Added new observation of PRIMARY MD: Duffy Rhody, MD (04/18/2010 16:19)       Past History:  Past Medical History: CAD    non-STEMI.. April 01, 2010, catheterization January 9, bare-metal stent to the ramus, bare-metal stent to the circumflex... also 80% stenosis small second diagonal to be followed Atrial fibrillation   new diagnoses... hospital... January, 2012... Coumadin not used with aspirin and Plavix and 77 years of age, to be reconsidered after one month,, and consideration of cardioversion Aortic stenosis   moderate... echo... January, 2012 Aortic insufficiency... mild... echo... January, 2012 LV   normal function... echo.. January, 2012... EF 60% Mitral regurgitation... mild.... echo.... January,  2012...severe annular calcification Hypertension Diverticulosis Pulmonary hypertension     67 mmHg... echo... January, 2012

## 2010-04-29 LAB — BASIC METABOLIC PANEL
BUN: 19 mg/dL (ref 6–23)
CO2: 23 mEq/L (ref 19–32)
Calcium: 9 mg/dL (ref 8.4–10.5)
GFR calc non Af Amer: 35 mL/min — ABNORMAL LOW (ref >60)
Glucose, Bld: 104 mg/dL — ABNORMAL HIGH (ref 70–99)
Potassium: 4.6 mEq/L (ref 3.5–5.1)

## 2010-04-29 LAB — CBC
HCT: 28.6 % — ABNORMAL LOW (ref 36.0–46.0)
MCHC: 33.9 g/dL (ref 30.0–36.0)
MCV: 93.2 fL (ref 78.0–100.0)
RDW: 14.8 % (ref 11.5–15.5)

## 2010-04-29 LAB — PROTIME-INR: Prothrombin Time: 24.5 seconds — ABNORMAL HIGH (ref 11.6–15.2)

## 2010-05-02 ENCOUNTER — Encounter: Payer: Self-pay | Admitting: Cardiology

## 2010-05-02 ENCOUNTER — Encounter (INDEPENDENT_AMBULATORY_CARE_PROVIDER_SITE_OTHER): Payer: Medicare Other

## 2010-05-02 DIAGNOSIS — I4891 Unspecified atrial fibrillation: Secondary | ICD-10-CM

## 2010-05-02 DIAGNOSIS — Z7901 Long term (current) use of anticoagulants: Secondary | ICD-10-CM

## 2010-05-05 NOTE — Discharge Summary (Addendum)
[name], [name] NO.:  [id number]  MEDICAL RECORD NO.:  [id number]           PATIENT TYPE:  I  LOCATION:  3711                         FACILITY:  [facility]  PHYSICIAN:  Marca Ancona, MD      DATE OF BIRTH:  Jul 16, 1918  DATE OF ADMISSION:  04/23/2010 DATE OF DISCHARGE:  04/29/2010                              DISCHARGE SUMMARY   DISCHARGE DIAGNOSES: 1. 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 2. Renal insufficiency with creatinine of 1.41 at discharge. 3. Atrial fibrillation, first diagnosed in January 2012, initiated on     Coumadin this admission. 4. Coronary artery disease, status post non-ST-segment elevation     myocardial infarction with successful percutaneous coronary     intervention and bare-metal stenting to the ramus intermedius and     proximal left circumflex on April 05, 2010.     a.     Plavix discontinued this admission secondary to anemia with  concurrent Coumadin implementation. 5. Hypertension. 6. History of diverticulosis. 7. Status post hysterectomy. 8. Anemia, discharge hemoglobin of 9.7, stable. 9. Left ventricular ejection fraction 60-65% with moderate aortic     stenosis and mild aortic root regurgitation, mild MR, and pulmonary     artery pressure of 60-70 mmHg by echo on April 02, 2010. 10.Low-density lesion in the upper pole of left kidney with central     calcification, may represent dilated calix containing calculus and     calcified mass, consider MRI to further characterize as outpatient. 11.Significant stenosis in proximal left renal artery, suspected by CT     of abdomen and pelvis on April 23, 2010. 12.Cirrhotic appearance of the liver on April 23, 2010, by CT scan.  HOSPITAL COURSE:  [name] is an 77 year old female with a history of hypertension and recently diagnosed coronary artery disease and atrial fibrillation earlier in January 2012.  [name] was discharged in  good condition.  The plan was that after being on aspirin and Plavix for 4-6 weeks given her bare-metal stent insertion, the Plavix could be stopped and [name] could be switched at that time to Coumadin if her atrial fibrillation persisted.  A week prior to admission at this time, [name] began to experience nagging pain in her right flank, intermittent, not constant.  The patient came to the ER where initial evaluation with CT scan of the abdomen with contrast which demonstrated the patient had thrombus in the main portion of the right renal artery with resultant hypoperfusion of the right kidney.  Her creatinine was 1.39 on admission, bumped to a peak of 1.79 and at discharge is 1.41.  This was felt to be embolic in nature given her atrial fibrillation.  [name] was started on IV heparin.  Beta-blocker was continued and [name] was started on IV Cardizem for rate control as her heart rates are in 110s-120s. This was changed to p.o. dosing on the following day.  [name] was initiated on Coumadin.  Her hemoglobin did decrease slightly during her hospital stay from an admission hemoglobin of 10.1 down to 8.7.  Secondary  to this, Dr. Myrtis Ser felt her Plavix should be discontinued.  Her INR continued to trend up and [name] felt improved.  Today, [name] is doing well without dyspnea and is rate controlled, in atrial fibrillation.  Dr. Shirlee Latch has seen and examined her today and feels [name] is stable for discharge.  He would like her to follow up with Dr. Myrtis Ser in 2 weeks and have an INR check at the [facility] on Monday.  Pharmacy has recommended to continue Coumadin 5 mg p.o. daily through the weekend and also with a check on Monday.  DISCHARGE LABORATORY DATA:  WBC 6.0, hemoglobin 9.7, hematocrit 28.6, and platelet count 134.  Sodium 139, potassium 4.6, chloride 107, CO2 of 23, glucose 104, BUN 9, creatinine 1.41, and INR was 2.19.  STUDIES: 1. Chest x-ray on April 27, 2010, showed small bilateral pleural      effusions. 2. CT abdomen and pelvis with contrast on April 23, 2010, showed     findings worrisome for thrombosis of the right renal artery and     developing infarct throughout the right kidney.  Significant     stenosis in the proximal left main artery as expected by left renal     enhancement.  Low-density lesion in the upper left pole of the     kidney with central calcification, may represent a dilated     containing calculus or calcified mass.  Consider MRI to further     characterize.  Cirrhotic appearance of the liver.  Small amount of     free fluid with air in the pelvis.  Small bilateral pleural     effusions.  Lumbar spinal stenosis at L4-L5, nonspecific     gallbladder wall thickening, ventral hernia containing adipose     issue.  DISCHARGE MEDICATIONS: 1. Coumadin 5 mg, was instructed to take 1 tablet daily until Monday     and have her INR checked on May 02, 2010. 2. Aspirin 81 mg daily. 3. Diltiazem 240 mg daily which is an increased dose for better rate     control. 4. Alprazolam 0.5 mg t.i.d. p.r.n. anxiety. 5. Lipitor 10 mg daily at bedtime. 6. Lopressor 50 mg b.i.d. 7. Nitroglycerin sublingual 0.4 mg every 5 minutes as needed for chest     pain. 8. Vitamin D 50,000 units weekly. 9. Zolpidem 5 mg daily nightly p.r.n.  Please note the patient's Plavix was stopped due to the rest of triple anticoagulation therapy this admission.  Her Mobic was also stopped secondary to risk of bleeding.  DISPOSITION:  [name] will be discharged in stable condition to home. [name] is to follow a low-sodium, heart-healthy diet and to follow up with her primary care doctor for evaluation of her anemia as her blood counts were somewhat low during the hospital stay.  Fecal occult blood testing was negative.  [name] will follow up with Dr. Henrietta Hoover office on May 17, 2010, at 9:45 a.m.  Prior to that appointment, however, [name] will have her first outpatient INR check on May 02, 2010, at 4:15 p.m., being initiated on Coumadin this admission.  DURATION OF DISCHARGE ENCOUNTER:  Greater than 30 minutes including physician and PA time.     Ronie Spies, P.A.C.   ______________________________ Marca Ancona, MD   DD/MEDQ  D:  04/29/2010  T:  04/30/2010  Job:  [id number]  cc:   Luis Abed, MD, [facility] Brent Bulla, MD  Electronically Signed by Marca Ancona MD on 05/05/2010 08:41:30 AM Electronically Signed  by Ronie Spies  on 05/09/2010 09:43:03 PM

## 2010-05-09 ENCOUNTER — Encounter (INDEPENDENT_AMBULATORY_CARE_PROVIDER_SITE_OTHER): Payer: Medicare Other

## 2010-05-09 ENCOUNTER — Encounter: Payer: Self-pay | Admitting: Cardiology

## 2010-05-09 DIAGNOSIS — I4891 Unspecified atrial fibrillation: Secondary | ICD-10-CM

## 2010-05-09 DIAGNOSIS — Z7901 Long term (current) use of anticoagulants: Secondary | ICD-10-CM

## 2010-05-10 ENCOUNTER — Encounter: Payer: Self-pay | Admitting: Cardiology

## 2010-05-12 NOTE — Medication Information (Signed)
Summary: [facility]in [facility]  Anticoagulant Therapy  Managed by: Weston Brass, PharmD Referring MD: Marca Ancona PCP: Brent Bulla, MD Supervising MD: Jens Som MD, Arlys John Indication 1: Atrial Fibrillation Indication 2: Renal Artery Thombosis Lab Used: [facility] [place] Site: [address] INR POC 4.6 INR RANGE 2.0-3.0  Dietary changes: no    Health status changes: no    Bleeding/hemorrhagic complications: no    Recent/future hospitalizations: yes       Details: recent hospitalization for afib and new renal artery thrombus.  Discharged on 2/3.  INR was 2.19 on discharge but pt had been holding Coumadin x 2 days due to elevated INR after 3 days of 5mg  and 1 day of 7.5mg .  Discharged on 5mg  daily  Any changes in medication regimen? no    Recent/future dental: no  Any missed doses?: no       Is patient compliant with meds? yes      Comments: Pt and caregiver given Coumadin education.  Discussed dietary issues, bleeding risks, and medication interactions.   Anticoagulation Management History:      The patient comes in today for her initial visit for anticoagulation therapy.  Positive risk factors for bleeding include an age of 2 years or older.  The bleeding index is 'intermediate risk'.  Positive CHADS2 values include History of HTN and Age > 46 years old.  Anticoagulation responsible provider: Jens Som MD, Arlys John.  INR POC: 4.6.  Cuvette Lot#: [id number].  Exp: 04/2011.    Anticoagulation Management Assessment/Plan:      The target INR is 2.0-3.0.  The next INR is due 05/09/2010.  Results were reviewed/authorized by Weston Brass, PharmD.  She was notified by Margot Chimes PharmD Candidate.         Current Anticoagulation Instructions: INR 4.6  Do not take any Coumadin today or tomorrow (Monday and Tuesday), then start taking 1/2 tablet everyday.  Recheck INR in 1 week.

## 2010-05-16 ENCOUNTER — Encounter: Payer: Self-pay | Admitting: Cardiology

## 2010-05-17 ENCOUNTER — Encounter: Payer: Self-pay | Admitting: Cardiology

## 2010-05-17 ENCOUNTER — Encounter (INDEPENDENT_AMBULATORY_CARE_PROVIDER_SITE_OTHER): Payer: Medicare Other | Admitting: Cardiology

## 2010-05-17 ENCOUNTER — Other Ambulatory Visit: Payer: Self-pay | Admitting: Cardiology

## 2010-05-17 ENCOUNTER — Encounter (INDEPENDENT_AMBULATORY_CARE_PROVIDER_SITE_OTHER): Payer: Medicare Other

## 2010-05-17 DIAGNOSIS — I4891 Unspecified atrial fibrillation: Secondary | ICD-10-CM

## 2010-05-17 DIAGNOSIS — D649 Anemia, unspecified: Secondary | ICD-10-CM

## 2010-05-17 DIAGNOSIS — Z7901 Long term (current) use of anticoagulants: Secondary | ICD-10-CM

## 2010-05-17 DIAGNOSIS — I2510 Atherosclerotic heart disease of native coronary artery without angina pectoris: Secondary | ICD-10-CM

## 2010-05-17 LAB — CBC WITH DIFFERENTIAL/PLATELET
Basophils Absolute: 0 10*3/uL (ref 0.0–0.1)
Basophils Relative: 0.6 % (ref 0.0–3.0)
Eosinophils Absolute: 0.4 10*3/uL (ref 0.0–0.7)
HCT: 27.8 % — ABNORMAL LOW (ref 36.0–46.0)
Hemoglobin: 9.4 g/dL — ABNORMAL LOW (ref 12.0–15.0)
Lymphocytes Relative: 8.3 % — ABNORMAL LOW (ref 12.0–46.0)
Lymphs Abs: 0.6 10*3/uL — ABNORMAL LOW (ref 0.7–4.0)
MCHC: 34 g/dL (ref 30.0–36.0)
MCV: 95 fl (ref 78.0–100.0)
Monocytes Absolute: 0.5 10*3/uL (ref 0.1–1.0)
Neutro Abs: 5.9 10*3/uL (ref 1.4–7.7)
RDW: 14.8 % — ABNORMAL HIGH (ref 11.5–14.6)

## 2010-05-18 LAB — BASIC METABOLIC PANEL
CO2: 25 mEq/L (ref 19–32)
Calcium: 8.7 mg/dL (ref 8.4–10.5)
Chloride: 110 mEq/L (ref 96–112)
Glucose, Bld: 94 mg/dL (ref 70–99)
Sodium: 142 mEq/L (ref 135–145)

## 2010-05-18 NOTE — Medication Information (Addendum)
Summary: [facility]in Clinic  Anticoagulant Therapy  Managed by: Georgina Pillion, PharmD Referring MD: Marca Ancona PCP: Brent Bulla, MD Supervising MD: Cassell Clement Indication 1: Atrial Fibrillation Indication 2: Renal Artery Thombosis Lab Used: [facility] [place] Site: [address] INR POC 2.4 INR RANGE 2.0-3.0  Dietary changes: yes       Details: Patient is not eating any greens  Health status changes: no    Bleeding/hemorrhagic complications: no    Recent/future hospitalizations: no    Any changes in medication regimen? no    Recent/future dental: no  Any missed doses?: no       Is patient compliant with meds? yes       Anticoagulation Management History:      Positive risk factors for bleeding include an age of 76 years or older.  The bleeding index is 'intermediate risk'.  Positive CHADS2 values include History of HTN and Age > 14 years old.  Anticoagulation responsible provider: Cassell Clement.  INR POC: 2.4.  Cuvette Lot#: [id number].  Exp: 03/2011.    Anticoagulation Management Assessment/Plan:      The target INR is 2.0-3.0.  The next INR is due 05/10/2010.  Results were reviewed/authorized by Georgina Pillion, PharmD.  She was notified by Georgina Pillion PharmD.         Prior Anticoagulation Instructions: INR 4.6  Do not take any Coumadin today or tomorrow (Monday and Tuesday), then start taking 1/2 tablet everyday.  Recheck INR in 1 week.   Current Anticoagulation Instructions: Continue current regimen of 1/2 tablet (2.5 mg) daily.  INR 2.4

## 2010-05-24 NOTE — Miscellaneous (Signed)
  Clinical Lists Changes  Problems: Added new problem of * RENAL ARTERY THROMBUS.[name] EMBOLIC Added new problem of * RENAL ARTERY STENOSIS LEFT Added new problem of COUMADIN THERAPY (ICD-V58.61) Added new problem of * RENAL MASS  LEFT Added new problem of RENAL INSUFFICIENCY, CHRONIC (ICD-585.9) Added new problem of * ANEMIA Added new problem of * CIRRHOTIC APPEARANCE LIVER Observations: Added new observation of PAST MED HX: CAD    non-STEMI.. April 01, 2010, catheterization January 9, bare-metal stent to the ramus, bare-metal stent to the circumflex... also 80% stenosis small second diagonal to be followed  /   Plavix stopped April 23, 2010 when patient presented with renal artery thrombus... patient switched to heparin/Coumadin Atrial fibrillation   new diagnoses... hospital... January, 2012... Coumadin not used with aspirin and Plavix and 77 years of age  /  Coumadin started January 28,2012 when patient presented with renal artery thrombus Aortic stenosis   moderate... echo... January, 2012 Aortic insufficiency... mild... echo... January, 2012 EF 60%    .. echo.. January, 2012... Mitral regurgitation... mild.... echo.... January, 2012...severe annular calcification Hypertension Diverticulosis Pulmonary hypertension     67 mmHg... echo... January, 2012 .[name] Renal artery thrombus   (right)  suspected embolic April 23, 2010..... heparin/Coumadin initiated Mass... low-density... upper pole left kidney... CT... January, 2012.[name] etiology unclear... MRI can be considered Renal artery stenosis.... significant... suspected by abdominal CT.. April 23, 2010 Renal insufficiency     creatinine 1.4 at discharge  April 29, 2010 Anemia    hemoglobin 9.7.... February, 2012 Cirrhotic appearance of the liver    abdominal CT... April 23, 2010 (05/16/2010 13:30) Added new observation of PRIMARY MD: Brent Bulla, MD (05/16/2010 13:30)       Past History:  Past Medical History: CAD     non-STEMI.. April 01, 2010, catheterization January 9, bare-metal stent to the ramus, bare-metal stent to the circumflex... also 80% stenosis small second diagonal to be followed  /   Plavix stopped April 23, 2010 when patient presented with renal artery thrombus... patient switched to heparin/Coumadin Atrial fibrillation   new diagnoses... hospital... January, 2012... Coumadin not used with aspirin and Plavix and 76 years of age  /  Coumadin started January 28,2012 when patient presented with renal artery thrombus Aortic stenosis   moderate... echo... January, 2012 Aortic insufficiency... mild... echo... January, 2012 EF 60%    .. echo.. January, 2012... Mitral regurgitation... mild.... echo.... January, 2012...severe annular calcification Hypertension Diverticulosis Pulmonary hypertension     67 mmHg... echo... January, 2012 .[name] Renal artery thrombus   (right)  suspected embolic April 23, 2010..... heparin/Coumadin initiated Mass... low-density... upper pole left kidney... CT... January, 2012.[name] etiology unclear... MRI can be considered Renal artery stenosis.... significant... suspected by abdominal CT.. April 23, 2010 Renal insufficiency     creatinine 1.4 at discharge  April 29, 2010 Anemia    hemoglobin 9.7.... February, 2012 Cirrhotic appearance of the liver    abdominal CT... April 23, 2010

## 2010-05-24 NOTE — Medication Information (Signed)
Summary: rov/cb  Anticoagulant Therapy  Managed by: Windell Hummingbird, RN Referring MD: Marca Ancona PCP: Brent Bulla, MD Supervising MD: Cassell Clement Indication 1: Atrial Fibrillation Indication 2: Renal Artery Thombosis Lab Used: [facility] [place] Site: [address] INR POC 2.2 INR RANGE 2.0-3.0  Dietary changes: no    Health status changes: no    Bleeding/hemorrhagic complications: no    Recent/future hospitalizations: no    Any changes in medication regimen? no    Recent/future dental: no  Any missed doses?: no       Is patient compliant with meds? yes       Anticoagulation Management History:      The patient is taking warfarin and comes in today for a routine follow up visit.  Positive risk factors for bleeding include an age of 29 years or older.  The bleeding index is 'intermediate risk'.  Positive CHADS2 values include History of HTN and Age > 64 years old.  Anticoagulation responsible provider: Cassell Clement.  INR POC: 2.2.  Cuvette Lot#: [id number].  Exp: 04/2011.    Anticoagulation Management Assessment/Plan:      The target INR is 2.0-3.0.  The next INR is due 05/31/2010.  Results were reviewed/authorized by Windell Hummingbird, RN.  She was notified by Windell Hummingbird, RN.         Prior Anticoagulation Instructions: Continue current regimen of 1/2 tablet (2.5 mg) daily.  INR 2.4  Current Anticoagulation Instructions: INR 2.2 Continue taking 1/2 tablet every day.  Recheck in 2 weeks.

## 2010-05-24 NOTE — Assessment & Plan Note (Signed)
Summary: EPH PER DAYNA CALL /LG   Visit Type:  post hospital visit Primary Provider:  Brent Bulla, MD  CC:  CAD and Atrial fibrillation.  History of Present Illness: Patient returns post hospitalization.  I did see her in the office on April 19, 2010.  That visit occurred after a hospitalization for a non-STEMI.  She had received a bare-metal stent to the ramus and a bare-metal stent to the circumflex.  There was residual 80% stenosis of the small diagonal to be followed medically.  Ejection fraction percent.  She had atrial fibrillation which is deemed to be a new diagnosis in January, 2012.  Because of her age and bleeding risk decision was made to use aspirin and Plavix for a month for her coronary disease and not use Coumadin at that time.  The patient then presented to the hospital on April 23, 2010.Marland Kitchen  She had a clot in her right renal artery.  Decision was made at that time to stop her Plavix and start heparin and Coumadin.  She stabilized nicely in the hospital.  Creatinine at the time of discharge was 1.4.  She has not had return of any further flank pain.  She's not having any chest pain.  I have carefully reviewed all the hospital records.  This chart with past medical history has been completely updated by me for this visit.  Current Medications (verified): 1)  Vitamin D2 1.25mg  (50000 Units) .... Take 1 Cap Every Week 2)  Alprazolam 0.5 Mg Tabs (Alprazolam) .... Take 1 Tablet By Mouth Three Times A Day As Needed 3)  Atorvastatin Calcium 10 Mg Tabs (Atorvastatin Calcium) .... Take 1 Tablet By Mouth Once A Day 4)  Metoprolol Tartrate 50 Mg Tabs (Metoprolol Tartrate) .... Take One Tablet By Mouth Twice A Day 5)  Ambien 5 Mg Tabs (Zolpidem Tartrate) .... At Bedtime As Needed 6)  Meloxicam 7.5 Mg Tabs (Meloxicam) .... As Needed 7)  Diltiazem Hcl Er Beads 240 Mg Xr24h-Cap (Diltiazem Hcl Er Beads) .... Take One Capsule By Mouth Daily 8)  Aspirin 81 Mg Tbec (Aspirin) .... Take One  Tablet By Mouth Daily 9)  Nitrostat 0.4 Mg Subl (Nitroglycerin) .Marland Kitchen.. 1 Tablet Under Tongue At Onset of Chest Pain; You May Repeat Every 5 Minutes For Up To 3 Doses. 10)  Warfarin Sodium 5 Mg Tabs (Warfarin Sodium) .... Use As Directed By Anticoagulation Clinic  Allergies (verified): 1)  ! Pcn 2)  ! Sulfa  Past History:  Past Medical History: CAD    non-STEMI.. April 01, 2010, catheterization January 9, bare-metal stent to the ramus, bare-metal stent to the circumflex... also 80% stenosis small second diagonal to be followed  /   Plavix stopped April 23, 2010 when patient presented with renal artery thrombus... patient switched to heparin/Coumadin Atrial fibrillation   new diagnoses... hospital... January, 2012... Coumadin not used with aspirin and Plavix and 77 years of age  /  Coumadin started January 28,2012 when patient presented with renal artery thrombus Aortic stenosis   moderate... echo... January, 2012 Aortic insufficiency... mild... echo... January, 2012 EF 60%    .. echo.. January, 2012... Mitral regurgitation... mild.... echo.... January, 2012...severe annular calcification.. Hypertension Diverticulosis Pulmonary hypertension     67 mmHg... echo... 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 heparin/Coumadin initiated Mass... low-density... upper pole left kidney... CT... January, 2012.Marland Kitchen etiology unclear... MRI can be considered Renal artery stenosis.... significant... suspected by abdominal CT.. April 23, 2010 Renal insufficiency  creatinine 1.4 at discharge  April 29, 2010 Anemia    hemoglobin 9.7.... February, 2012 Cirrhotic appearance of the liver    abdominal CT... April 23, 2010  Review of Systems       The patient denies fever, chills, headache, sweats, rash, change in vision, change in hearing, chest pain, cough, nausea vomiting, urinary symptoms.  She does have some mild back discomfort..  All other systems are  reviewed and are negative.  Vital Signs:  Patient profile:   77 year old female Height:      63 inches Weight:      154 pounds BMI:     27.38 Pulse rate:   70 / minute BP sitting:   132 / 78  (left arm) Cuff size:   regular  Vitals Entered By: Hardin Negus, RMA (May 17, 2010 9:12 AM)  Physical Exam  General:  Patient looks great today.  She is here with her helper Head:  head is atraumatic. Eyes:  no xanthelasma. Neck:  no jugular venous distention. Chest Wall:  no chest wall tenderness. Lungs:  lungs are clear.  Respiratory effort is unlabored. Heart:  cardiac exam reveals S1-S2.  There is no clicks or significant murmurs. Abdomen:  abdomen is soft. Msk:  there no obvious abnormalities on her back related to mild back pain. Extremities:  no peripheral edema. Skin:  no skin rashes. Psych:  patient is oriented to person time and place.  Affect is normal.   Impression & Recommendations:  Problem # 1:  * CIRRHOTIC APPEARANCE LIVER This was noted at the time of the abdominal CT scan in the hospital.  No further workup needed  Problem # 2:  * ANEMIA  Hemoglobin was 9.7 in the hospital.  CBC will be checked today  Orders: TLB-BMP (Basic Metabolic Panel-BMET) (80048-METABOL) TLB-CBC Platelet - w/Differential (85025-CBCD)  Problem # 3:  RENAL INSUFFICIENCY, CHRONIC (ICD-585.9) When . the patient was discharged from the hospital creatinine was 1.4. Chemistry will be checked today.  Problem # 4:  * RENAL MASS  LEFT There is an incidental finding of question of a mass in her left kidney.  This is a low density area.  It is not definite mass.  MRI could be considered.  I've chosen not to evaluate this further at this time.  Problem # 5:  COUMADIN THERAPY (ICD-V58.61) The patient is tolerating Coumadin.  This needs to be continued.  Problem # 6:  * RENAL ARTERY STENOSIS LEFT This was noted at the time of her CT related to her renal thrombus.  No further workup at this  time.  Problem # 7:  * RENAL ARTERY THROMBUS.Marland Kitchen EMBOLIC The patient is doing well on Coumadin.  This needs to be continued.  Problem # 8:  HYPERTENSION (ICD-401.9) Blood pressure is under good control.  No change in therapy.  Problem # 9:  ATRIAL FIBRILLATION, HX OF (ICD-V12.59) Atrial fibrillation rate is controlled.  Atrial fib was noted in January, 2012.  After she is on Coumadin for a long period of time, I will consider cardioversion.  I may consider TEE cardioversion because she had a large clot to her renal artery. However she does not have significant symptoms and she will be followed with atrial fibrillation at this time.  Problem # 10:  CAD (ICD-414.00)  Coronary disease is stable.  Fortunately she received Plavix for several weeks.  She is now on aspirin and Coumadin.  No further workup.  We'll see her back in 4  weeks.  As part of today's evaluation I have extensively updated the chart and reviewed her hospital information.  Orders: TLB-BMP (Basic Metabolic Panel-BMET) (80048-METABOL) TLB-CBC Platelet - w/Differential (85025-CBCD)  Patient Instructions: 1)  Labs today 2)  Follow up in 4 weeks

## 2010-05-25 ENCOUNTER — Encounter (INDEPENDENT_AMBULATORY_CARE_PROVIDER_SITE_OTHER): Payer: Self-pay | Admitting: *Deleted

## 2010-05-31 ENCOUNTER — Encounter: Payer: Self-pay | Admitting: Cardiology

## 2010-05-31 ENCOUNTER — Encounter (INDEPENDENT_AMBULATORY_CARE_PROVIDER_SITE_OTHER): Payer: Medicare Other

## 2010-05-31 DIAGNOSIS — N280 Ischemia and infarction of kidney: Secondary | ICD-10-CM

## 2010-05-31 DIAGNOSIS — Z8679 Personal history of other diseases of the circulatory system: Secondary | ICD-10-CM

## 2010-05-31 DIAGNOSIS — I4891 Unspecified atrial fibrillation: Secondary | ICD-10-CM

## 2010-05-31 DIAGNOSIS — Z7901 Long term (current) use of anticoagulants: Secondary | ICD-10-CM

## 2010-06-02 NOTE — Letter (Signed)
Summary: New Patient letter  [facility]  [address] [place], [place] [address]   Phone: [phone]  Fax: [phone]       05/25/2010 MRN: [id number]  [name] [address] [place], [place]  [address]  [place]  Dear Ms. Kohn,  Welcome to the Gastroenterology Division at [facility].    You are scheduled to see Dr.  Christella Hartigan on 06-21-10 at 2:45P.M. on the 3rd floor at [facility], [address].  We ask that you try to arrive at our office 15 minutes prior to your appointment time to allow for check-in.  We would like you to complete the enclosed self-administered evaluation form prior to your visit and bring it with you on the day of your appointment.  We will review it with you.  Also, please bring a complete list of all your medications or, if you prefer, bring the medication bottles and we will list them.  Please bring your insurance card so that we may make a copy of it.  If your insurance requires a referral to see a specialist, please bring your referral form from your primary care physician.  Co-payments are due at the time of your visit and may be paid by cash, check or credit card.     Your office visit will consist of a consult with your physician (includes a physical exam), any laboratory testing he/she may order, scheduling of any necessary diagnostic testing (e.g. x-ray, ultrasound, CT-scan), and scheduling of a procedure (e.g. Endoscopy, Colonoscopy) if required.  Please allow enough time on your schedule to allow for any/all of these possibilities.    If you cannot keep your appointment, please call [phone] to cancel or reschedule prior to your appointment date.  This allows [place] the opportunity to schedule an appointment for another patient in need of care.  If you do not cancel or reschedule by 5 p.m. the business day prior to your appointment date, you will be charged a $50.00 late cancellation/no-show fee.    Thank you for choosing  [facility] for your medical needs.  We appreciate the opportunity to care for you.  Please visit [place] at our website  to learn more about our practice.                     Sincerely,                                                             The Gastroenterology Division

## 2010-06-04 ENCOUNTER — Encounter: Payer: Self-pay | Admitting: Cardiology

## 2010-06-07 NOTE — Medication Information (Signed)
Summary: rov/pc  Anticoagulant Therapy  Managed by: Samantha Crimes, PharmD Referring MD: Marca Ancona PCP: Brent Bulla, MD Supervising MD: Antoine Poche MD, Fayrene Fearing Indication 1: Atrial Fibrillation Indication 2: Renal Artery Thombosis Lab Used: [facility] [place] Site: [address] INR POC 2.5 INR RANGE 2.0-3.0  Dietary changes: no    Health status changes: no    Bleeding/hemorrhagic complications: no    Recent/future hospitalizations: no    Any changes in medication regimen? no    Recent/future dental: no  Any missed doses?: no       Is patient compliant with meds? yes       Current Medications (verified): 1)  Vitamin D2 1.25mg  (50000 Units) .... Take 1 Cap Every Week 2)  Alprazolam 0.5 Mg Tabs (Alprazolam) .... Take 1 Tablet By Mouth Three Times A Day As Needed 3)  Atorvastatin Calcium 10 Mg Tabs (Atorvastatin Calcium) .... Take 1 Tablet By Mouth Once A Day 4)  Metoprolol Tartrate 50 Mg Tabs (Metoprolol Tartrate) .... Take One Tablet By Mouth Twice A Day 5)  Ambien 5 Mg Tabs (Zolpidem Tartrate) .... At Bedtime As Needed 6)  Meloxicam 7.5 Mg Tabs (Meloxicam) .... As Needed 7)  Diltiazem Hcl Er Beads 240 Mg Xr24h-Cap (Diltiazem Hcl Er Beads) .... Take One Capsule By Mouth Daily 8)  Aspirin 81 Mg Tbec (Aspirin) .... Take One Tablet By Mouth Daily 9)  Nitrostat 0.4 Mg Subl (Nitroglycerin) .Marland Kitchen.. 1 Tablet Under Tongue At Onset of Chest Pain; You May Repeat Every 5 Minutes For Up To 3 Doses. 10)  Warfarin Sodium 5 Mg Tabs (Warfarin Sodium) .... Use As Directed By Anticoagulation Clinic  Allergies (verified): 1)  ! Pcn 2)  ! Sulfa  Anticoagulation Management History:      Positive risk factors for bleeding include an age of 75 years or older and presence of serious comorbidities.  The bleeding index is 'intermediate risk'.  Positive CHADS2 values include History of HTN and Age > 5 years old.  Anticoagulation responsible provider: Antoine Poche MD, Fayrene Fearing.  INR POC:  2.5.  Exp: 04/2011.    Anticoagulation Management Assessment/Plan:      The patient's current anticoagulation dose is Warfarin sodium 5 mg tabs: Use as directed by Anticoagulation Clinic.  The target INR is 2.0-3.0.  The next INR is due 06/21/2010.  Results were reviewed/authorized by Samantha Crimes, PharmD.         Prior Anticoagulation Instructions: INR 2.2 Continue taking 1/2 tablet every day.  Recheck in 2 weeks.  Current Anticoagulation Instructions: Cont with current regimen Return to clinic 3/27 @ 130 pm

## 2010-06-09 ENCOUNTER — Ambulatory Visit: Payer: Self-pay | Admitting: Cardiology

## 2010-06-12 ENCOUNTER — Emergency Department (HOSPITAL_COMMUNITY)
Admission: EM | Admit: 2010-06-12 | Discharge: 2010-06-12 | Disposition: A | Discharge status: Home / Self Care | Payer: Medicare Other | Attending: Emergency Medicine | Admitting: Emergency Medicine

## 2010-06-12 DIAGNOSIS — R110 Nausea: Secondary | ICD-10-CM | POA: Insufficient documentation

## 2010-06-12 DIAGNOSIS — I4891 Unspecified atrial fibrillation: Secondary | ICD-10-CM | POA: Insufficient documentation

## 2010-06-12 DIAGNOSIS — I4949 Other premature depolarization: Secondary | ICD-10-CM | POA: Insufficient documentation

## 2010-06-12 DIAGNOSIS — E876 Hypokalemia: Secondary | ICD-10-CM | POA: Insufficient documentation

## 2010-06-12 DIAGNOSIS — I10 Essential (primary) hypertension: Secondary | ICD-10-CM | POA: Insufficient documentation

## 2010-06-12 DIAGNOSIS — I2510 Atherosclerotic heart disease of native coronary artery without angina pectoris: Secondary | ICD-10-CM | POA: Insufficient documentation

## 2010-06-12 LAB — CBC
MCH: 30.4 pg (ref 26.0–34.0)
MCHC: 33 g/dL (ref 30.0–36.0)
MCV: 92.2 fL (ref 78.0–100.0)
Platelets: 149 10*3/uL — ABNORMAL LOW (ref 150–400)

## 2010-06-12 LAB — DIFFERENTIAL
Basophils Relative: 1 % (ref 0–1)
Eosinophils Absolute: 0.3 10*3/uL (ref 0.0–0.7)
Eosinophils Relative: 5 % (ref 0–5)
Lymphs Abs: 1 10*3/uL (ref 0.7–4.0)
Monocytes Absolute: 0.5 10*3/uL (ref 0.1–1.0)
Monocytes Relative: 8 % (ref 3–12)

## 2010-06-12 LAB — COMPREHENSIVE METABOLIC PANEL
ALT: 20 U/L (ref 0–35)
Albumin: 3.5 g/dL (ref 3.5–5.2)
Alkaline Phosphatase: 58 U/L (ref 39–117)
BUN: 17 mg/dL (ref 6–23)
Chloride: 105 mEq/L (ref 96–112)
Glucose, Bld: 102 mg/dL — ABNORMAL HIGH (ref 70–99)
Potassium: 3.1 mEq/L — ABNORMAL LOW (ref 3.5–5.1)
Sodium: 137 mEq/L (ref 135–145)
Total Bilirubin: 0.6 mg/dL (ref 0.3–1.2)
Total Protein: 6.2 g/dL (ref 6.0–8.3)

## 2010-06-12 LAB — URINALYSIS, ROUTINE W REFLEX MICROSCOPIC
Bilirubin Urine: NEGATIVE
Ketones, ur: NEGATIVE mg/dL
Specific Gravity, Urine: 1.02 (ref 1.005–1.030)
pH: 6 (ref 5.0–8.0)

## 2010-06-12 LAB — URINE MICROSCOPIC-ADD ON

## 2010-06-12 LAB — TROPONIN I: Troponin I: 0.02 ng/mL (ref 0.00–0.06)

## 2010-06-12 LAB — LIPASE, BLOOD: Lipase: 46 U/L (ref 11–59)

## 2010-06-12 LAB — CK TOTAL AND CKMB (NOT AT ARMC): Total CK: 55 U/L (ref 7–177)

## 2010-06-12 LAB — PROTIME-INR: Prothrombin Time: 21.1 seconds — ABNORMAL HIGH (ref 11.6–15.2)

## 2010-06-15 ENCOUNTER — Emergency Department (HOSPITAL_COMMUNITY)
Admission: EM | Admit: 2010-06-15 | Discharge: 2010-06-15 | Disposition: A | Discharge status: Home / Self Care | Payer: Medicare Other | Attending: Emergency Medicine | Admitting: Emergency Medicine

## 2010-06-15 ENCOUNTER — Emergency Department (HOSPITAL_COMMUNITY): Payer: Medicare Other

## 2010-06-15 DIAGNOSIS — I10 Essential (primary) hypertension: Secondary | ICD-10-CM | POA: Insufficient documentation

## 2010-06-15 DIAGNOSIS — I2510 Atherosclerotic heart disease of native coronary artery without angina pectoris: Secondary | ICD-10-CM | POA: Insufficient documentation

## 2010-06-15 DIAGNOSIS — R112 Nausea with vomiting, unspecified: Secondary | ICD-10-CM | POA: Insufficient documentation

## 2010-06-15 LAB — BASIC METABOLIC PANEL
Calcium: 8.8 mg/dL (ref 8.4–10.5)
GFR calc Af Amer: 53 mL/min — ABNORMAL LOW (ref >60)
GFR calc non Af Amer: 44 mL/min — ABNORMAL LOW (ref >60)
Glucose, Bld: 111 mg/dL — ABNORMAL HIGH (ref 70–99)
Sodium: 138 mEq/L (ref 135–145)

## 2010-06-15 LAB — CBC
HCT: 30 % — ABNORMAL LOW (ref 36.0–46.0)
Hemoglobin: 9.8 g/dL — ABNORMAL LOW (ref 12.0–15.0)
MCV: 92.3 fL (ref 78.0–100.0)
RBC: 3.25 MIL/uL — ABNORMAL LOW (ref 3.87–5.11)
RDW: 15.2 % (ref 11.5–15.5)
WBC: 7.5 10*3/uL (ref 4.0–10.5)

## 2010-06-15 LAB — URINE MICROSCOPIC-ADD ON

## 2010-06-15 LAB — POCT CARDIAC MARKERS
CKMB, poc: 1.2 ng/mL (ref 1.0–8.0)
Myoglobin, poc: 74.4 ng/mL (ref 12–200)
Troponin i, poc: <0.05 ng/mL (ref 0.00–0.09)

## 2010-06-15 LAB — URINALYSIS, ROUTINE W REFLEX MICROSCOPIC
Glucose, UA: NEGATIVE mg/dL
Hgb urine dipstick: NEGATIVE
Leukocytes, UA: NEGATIVE
pH: 7 (ref 5.0–8.0)

## 2010-06-15 LAB — DIFFERENTIAL
Basophils Absolute: 0 10*3/uL (ref 0.0–0.1)
Lymphocytes Relative: 10 % — ABNORMAL LOW (ref 12–46)
Lymphs Abs: 0.8 10*3/uL (ref 0.7–4.0)
Neutro Abs: 5.8 10*3/uL (ref 1.7–7.7)

## 2010-06-17 ENCOUNTER — Encounter: Payer: Self-pay | Admitting: Cardiology

## 2010-06-17 ENCOUNTER — Ambulatory Visit (INDEPENDENT_AMBULATORY_CARE_PROVIDER_SITE_OTHER): Payer: Medicare Other | Admitting: Cardiology

## 2010-06-17 DIAGNOSIS — I2510 Atherosclerotic heart disease of native coronary artery without angina pectoris: Secondary | ICD-10-CM | POA: Insufficient documentation

## 2010-06-17 DIAGNOSIS — I350 Nonrheumatic aortic (valve) stenosis: Secondary | ICD-10-CM | POA: Insufficient documentation

## 2010-06-17 DIAGNOSIS — K7460 Unspecified cirrhosis of liver: Secondary | ICD-10-CM | POA: Insufficient documentation

## 2010-06-17 DIAGNOSIS — I272 Pulmonary hypertension, unspecified: Secondary | ICD-10-CM | POA: Insufficient documentation

## 2010-06-17 DIAGNOSIS — I10 Essential (primary) hypertension: Secondary | ICD-10-CM

## 2010-06-17 DIAGNOSIS — I351 Nonrheumatic aortic (valve) insufficiency: Secondary | ICD-10-CM | POA: Insufficient documentation

## 2010-06-17 DIAGNOSIS — I4891 Unspecified atrial fibrillation: Secondary | ICD-10-CM

## 2010-06-17 DIAGNOSIS — Z79899 Other long term (current) drug therapy: Secondary | ICD-10-CM | POA: Insufficient documentation

## 2010-06-17 DIAGNOSIS — N2889 Other specified disorders of kidney and ureter: Secondary | ICD-10-CM

## 2010-06-17 DIAGNOSIS — N280 Ischemia and infarction of kidney: Secondary | ICD-10-CM

## 2010-06-17 DIAGNOSIS — I340 Nonrheumatic mitral (valve) insufficiency: Secondary | ICD-10-CM | POA: Insufficient documentation

## 2010-06-17 DIAGNOSIS — I701 Atherosclerosis of renal artery: Secondary | ICD-10-CM | POA: Insufficient documentation

## 2010-06-17 DIAGNOSIS — N289 Disorder of kidney and ureter, unspecified: Secondary | ICD-10-CM | POA: Insufficient documentation

## 2010-06-17 DIAGNOSIS — D649 Anemia, unspecified: Secondary | ICD-10-CM | POA: Insufficient documentation

## 2010-06-17 NOTE — Assessment & Plan Note (Signed)
Blood pressure is controlled today. No change in therapy. 

## 2010-06-17 NOTE — Patient Instructions (Signed)
Your physician recommends that you schedule a follow-up appointment in: 3 months.  

## 2010-06-17 NOTE — Assessment & Plan Note (Signed)
Fortunately the patient has been stable since her renal artery embolus.  She's very carefully coumadinized.

## 2010-06-17 NOTE — Progress Notes (Signed)
HPI The patient is seen for followup of coronary disease, atrial fibrillation, Coumadin therapy, renal artery embolus.  I saw her last in the office May 16, 2010.  She was stable at that time.  Since that time she has been seen in the emergency room with some abdominal discomfort.  A CT scan suggests chronic cholecystitis.  The patient will be seen Dr.Byerly for surgical consultation on April 4.  Patient has not had any significant chest pain. Allergies  Allergen Reactions  . Penicillins   . Sulfonamide Derivatives     Current Outpatient Prescriptions  Medication Sig Dispense Refill  . ALPRAZolam (XANAX) 0.5 MG tablet Take 0.5 mg by mouth 3 (three) times daily as needed.        Marland Kitchen aspirin 81 MG tablet Take 81 mg by mouth daily.        Marland Kitchen atorvastatin (LIPITOR) 10 MG tablet Take 10 mg by mouth daily.        Marland Kitchen diltiazem (CARDIZEM CD) 240 MG 24 hr capsule Take 240 mg by mouth daily.        . meloxicam (MOBIC) 7.5 MG tablet Take 7.5 mg by mouth as needed.        . metoprolol (LOPRESSOR) 50 MG tablet Take 50 mg by mouth 2 (two) times daily.        . nitroGLYCERIN (NITROSTAT) 0.4 MG SL tablet Place 0.4 mg under the tongue every 5 (five) minutes as needed.        . potassium chloride (KLOR-CON) 10 MEQ CR tablet Take 10 mEq by mouth 2 (two) times daily.        Marland Kitchen warfarin (COUMADIN) 5 MG tablet Take by mouth as directed.        . zolpidem (AMBIEN) 5 MG tablet Take 5 mg by mouth at bedtime as needed.        Marland Kitchen DISCONTD: ergocalciferol (VITAMIN D2) 50000 UNITS capsule Take 50,000 Units by mouth once a week.          History   Social History  . Marital Status: Single    Spouse Name: N/A    Number of Children: N/A  . Years of Education: N/A   Occupational History  . retired    Social History Main Topics  . Smoking status: Never Smoker   . Smokeless tobacco: Not on file  . Alcohol Use: No  . Drug Use: No  . Sexually Active: Not on file   Other Topics Concern  . Not on file   Social  History Narrative  . No narrative on file    Family History  Problem Relation Age of Onset  . Hypertension      Past Medical History  Diagnosis Date  . CAD (coronary artery disease)     Non-STEMI January, 2012.. bare-metal stents to ramus and circumflex, 80% small second diagonal to be followed  /   . Aortic stenosis, moderate     Moderate.. echo.. January 2 012  . Aortic insufficiency     Mild... echo.. January, 2012  . Mitral regurgitation     EF 60%... echo... January, 2012,  mild MR, severe annular calcification  . HTN (hypertension)   . Diverticulosis   . Pulmonary HTN     67 mmHg.. echo.. January, 2012  . Renal arterial thrombosis     January, 2012 while on aspirin and Plavix for coronary stents ( atrial fibrillation) Plavix stopped and Coumadin started  . Renal insufficiency     Creatinine 1.4.. April 29, 2010  . Anemia     Hemoglobin 9.7, February, 2012  . Cirrhosis of liver     appearance On CT, January, 2012  . Drug therapy     Plavix used for bare-metal stent stopped after several weeks when Coumadin and started for renal artery thrombus  . Atrial fibrillation     New diagnosis January, 2012, Coumadin not used initially because of Plavix and aspirin and age  for stents, renal artery thrombus, Plavix stopped Coumadin started April 23, 2010  . Drug therapy     Coumadin.Marland Kitchen started April 23, 2010 after renal artery embolus (atrial fibrillation)  . Renal mass     Low density, upper pole left kidney, CT January 2 012, etiology unclear, MRI can be considered  . Renal artery stenosis     Suspected from abdominal CT January, 2012.. significant    Past Surgical History  Procedure Date  . Total abdominal hysterectomy   . Dilation and curettage of uterus     ROS  Patient denies fever, chills, headache, sweats, rash, change in vision, change in hearing, chest pain, cough, urinary symptoms.  All other systems are reviewed and are negative. PHYSICAL EXAM Patient  is stable today.  She is worried about the recurrent nausea that she's had.  Head is atraumatic.  There is no xanthelasma.  There is no jugular venous distention.  Lungs are clear.  Respiratory effort is not labored.  Cardiac exam reveals S1-S2 and a soft systolic murmur.  The abdomen is soft.  There is no significant peripheral edema today.  There are no musculoskeletal deformities. Filed Vitals:   06/17/10 0948  BP: 139/85  Pulse: 93  Resp: 14  Weight: 154 lb (69.854 kg)    EKG  EKG is not done today.

## 2010-06-17 NOTE — Assessment & Plan Note (Signed)
Atrial fibrillation rate is controlled.  She is on Coumadin.  No change in therapy at this time.

## 2010-06-17 NOTE — Assessment & Plan Note (Signed)
Coronary artery disease is stable.  She does have a diagonal lesion and we will follow clinically.  She has good left ventricular function.  There is question as to whether she can be cleared for gallbladder surgery.  From the overall cardiac viewpoint she can be cleared.  There is certainly increased risk.  In addition her anticoagulation will have to be followed there he carefully.  She must be bridged if surgery is to be done.

## 2010-06-21 ENCOUNTER — Ambulatory Visit: Payer: Medicare Other | Admitting: Gastroenterology

## 2010-06-21 ENCOUNTER — Ambulatory Visit (INDEPENDENT_AMBULATORY_CARE_PROVIDER_SITE_OTHER): Payer: Medicare Other | Admitting: *Deleted

## 2010-06-21 DIAGNOSIS — I4891 Unspecified atrial fibrillation: Secondary | ICD-10-CM

## 2010-06-21 DIAGNOSIS — N280 Ischemia and infarction of kidney: Secondary | ICD-10-CM

## 2010-06-21 DIAGNOSIS — Z8679 Personal history of other diseases of the circulatory system: Secondary | ICD-10-CM

## 2010-06-21 DIAGNOSIS — N2889 Other specified disorders of kidney and ureter: Secondary | ICD-10-CM

## 2010-06-21 LAB — POCT INR: INR: 1.6

## 2010-06-21 NOTE — Patient Instructions (Addendum)
INR 1.6 Take 1 tablet today (5mg ), then begin taking 1/2 tablet (2.5mg ) daily, except take 1 tablet (5mg ) on Wednesdays. Recheck in 2 weeks.

## 2010-06-22 NOTE — Progress Notes (Signed)
Cannot close encounter.  No return date

## 2010-06-29 ENCOUNTER — Other Ambulatory Visit (HOSPITAL_COMMUNITY): Payer: Self-pay | Admitting: General Surgery

## 2010-06-29 DIAGNOSIS — R110 Nausea: Secondary | ICD-10-CM

## 2010-06-29 DIAGNOSIS — N289 Disorder of kidney and ureter, unspecified: Secondary | ICD-10-CM

## 2010-06-29 DIAGNOSIS — R160 Hepatomegaly, not elsewhere classified: Secondary | ICD-10-CM

## 2010-07-01 ENCOUNTER — Emergency Department (HOSPITAL_COMMUNITY)
Admission: EM | Admit: 2010-07-01 | Discharge: 2010-07-01 | Disposition: A | Discharge status: Home / Self Care | Payer: Medicare Other | Attending: Emergency Medicine | Admitting: Emergency Medicine

## 2010-07-01 DIAGNOSIS — Z79899 Other long term (current) drug therapy: Secondary | ICD-10-CM | POA: Insufficient documentation

## 2010-07-01 DIAGNOSIS — I2510 Atherosclerotic heart disease of native coronary artery without angina pectoris: Secondary | ICD-10-CM | POA: Insufficient documentation

## 2010-07-01 DIAGNOSIS — R110 Nausea: Secondary | ICD-10-CM | POA: Insufficient documentation

## 2010-07-01 DIAGNOSIS — Z7901 Long term (current) use of anticoagulants: Secondary | ICD-10-CM | POA: Insufficient documentation

## 2010-07-01 DIAGNOSIS — I4891 Unspecified atrial fibrillation: Secondary | ICD-10-CM | POA: Insufficient documentation

## 2010-07-01 DIAGNOSIS — R109 Unspecified abdominal pain: Secondary | ICD-10-CM | POA: Insufficient documentation

## 2010-07-01 DIAGNOSIS — I10 Essential (primary) hypertension: Secondary | ICD-10-CM | POA: Insufficient documentation

## 2010-07-01 LAB — DIFFERENTIAL
Eosinophils Absolute: 0.4 10*3/uL (ref 0.0–0.7)
Eosinophils Relative: 4 % (ref 0–5)
Lymphocytes Relative: 8 % — ABNORMAL LOW (ref 12–46)
Lymphs Abs: 0.8 10*3/uL (ref 0.7–4.0)
Monocytes Absolute: 0.7 10*3/uL (ref 0.1–1.0)
Monocytes Relative: 7 % (ref 3–12)

## 2010-07-01 LAB — URINALYSIS, ROUTINE W REFLEX MICROSCOPIC
Ketones, ur: NEGATIVE mg/dL
Leukocytes, UA: NEGATIVE
Nitrite: NEGATIVE
Protein, ur: NEGATIVE mg/dL
pH: 6 (ref 5.0–8.0)

## 2010-07-01 LAB — COMPREHENSIVE METABOLIC PANEL
Alkaline Phosphatase: 70 U/L (ref 39–117)
BUN: 14 mg/dL (ref 6–23)
Creatinine, Ser: 1.09 mg/dL (ref 0.4–1.2)
Glucose, Bld: 111 mg/dL — ABNORMAL HIGH (ref 70–99)
Potassium: 4.2 mEq/L (ref 3.5–5.1)
Total Protein: 5.9 g/dL — ABNORMAL LOW (ref 6.0–8.3)

## 2010-07-01 LAB — CBC
HCT: 32.8 % — ABNORMAL LOW (ref 36.0–46.0)
MCH: 30.7 pg (ref 26.0–34.0)
MCHC: 33.2 g/dL (ref 30.0–36.0)
MCV: 92.4 fL (ref 78.0–100.0)
RDW: 15.1 % (ref 11.5–15.5)
WBC: 10.1 10*3/uL (ref 4.0–10.5)

## 2010-07-01 LAB — LIPASE, BLOOD: Lipase: 49 U/L (ref 11–59)

## 2010-07-01 LAB — URINE MICROSCOPIC-ADD ON

## 2010-07-02 LAB — URINE CULTURE: Colony Count: 15000

## 2010-07-06 ENCOUNTER — Ambulatory Visit (INDEPENDENT_AMBULATORY_CARE_PROVIDER_SITE_OTHER): Payer: Medicare Other | Admitting: *Deleted

## 2010-07-06 DIAGNOSIS — I4891 Unspecified atrial fibrillation: Secondary | ICD-10-CM

## 2010-07-06 DIAGNOSIS — N2889 Other specified disorders of kidney and ureter: Secondary | ICD-10-CM

## 2010-07-06 DIAGNOSIS — Z7901 Long term (current) use of anticoagulants: Secondary | ICD-10-CM

## 2010-07-06 DIAGNOSIS — Z8679 Personal history of other diseases of the circulatory system: Secondary | ICD-10-CM

## 2010-07-06 DIAGNOSIS — N280 Ischemia and infarction of kidney: Secondary | ICD-10-CM

## 2010-07-06 LAB — POCT INR: INR: 2.1

## 2010-07-06 NOTE — Patient Instructions (Addendum)
INR 2.1  Coumadin 5mg  tabs Take 1/2 tab each day EXCEPT 1 tab on WED Return in 4 weeks WED May 9 at 12 noon

## 2010-07-08 ENCOUNTER — Ambulatory Visit (HOSPITAL_COMMUNITY)
Admission: RE | Admit: 2010-07-08 | Discharge: 2010-07-08 | Disposition: A | Discharge status: Home / Self Care | Payer: Medicare Other | Source: Ambulatory Visit | Attending: General Surgery | Admitting: General Surgery

## 2010-07-08 ENCOUNTER — Other Ambulatory Visit (HOSPITAL_COMMUNITY): Payer: Medicare Other

## 2010-07-08 DIAGNOSIS — K7460 Unspecified cirrhosis of liver: Secondary | ICD-10-CM | POA: Insufficient documentation

## 2010-07-08 DIAGNOSIS — R110 Nausea: Secondary | ICD-10-CM | POA: Insufficient documentation

## 2010-07-08 DIAGNOSIS — K862 Cyst of pancreas: Secondary | ICD-10-CM | POA: Insufficient documentation

## 2010-07-08 DIAGNOSIS — I517 Cardiomegaly: Secondary | ICD-10-CM | POA: Insufficient documentation

## 2010-07-08 DIAGNOSIS — R160 Hepatomegaly, not elsewhere classified: Secondary | ICD-10-CM

## 2010-07-08 DIAGNOSIS — N289 Disorder of kidney and ureter, unspecified: Secondary | ICD-10-CM

## 2010-07-08 MED ORDER — GADOBENATE DIMEGLUMINE 529 MG/ML IV SOLN
14.0000 mL | Freq: Once | INTRAVENOUS | Status: AC
  Administered 2010-07-08: 14 mL via INTRAVENOUS

## 2010-07-11 ENCOUNTER — Other Ambulatory Visit (HOSPITAL_COMMUNITY): Payer: Medicare Other

## 2010-07-11 ENCOUNTER — Encounter (HOSPITAL_COMMUNITY)
Admission: RE | Admit: 2010-07-11 | Discharge: 2010-07-11 | Disposition: A | Discharge status: Home / Self Care | Payer: Medicare Other | Source: Ambulatory Visit | Attending: General Surgery | Admitting: General Surgery

## 2010-07-11 DIAGNOSIS — R110 Nausea: Secondary | ICD-10-CM | POA: Insufficient documentation

## 2010-07-21 ENCOUNTER — Encounter (HOSPITAL_COMMUNITY)
Admission: RE | Admit: 2010-07-21 | Discharge: 2010-07-21 | Disposition: A | Discharge status: Home / Self Care | Payer: Medicare Other | Source: Ambulatory Visit | Attending: General Surgery | Admitting: General Surgery

## 2010-07-21 DIAGNOSIS — R110 Nausea: Secondary | ICD-10-CM | POA: Insufficient documentation

## 2010-07-21 MED ORDER — TECHNETIUM TC 99M MEBROFENIN IV KIT
5.0000 | PACK | Freq: Once | INTRAVENOUS | Status: AC | PRN
  Administered 2010-07-21: 5 via INTRAVENOUS

## 2010-08-03 ENCOUNTER — Ambulatory Visit (INDEPENDENT_AMBULATORY_CARE_PROVIDER_SITE_OTHER): Payer: Medicare Other | Admitting: *Deleted

## 2010-08-03 DIAGNOSIS — N280 Ischemia and infarction of kidney: Secondary | ICD-10-CM

## 2010-08-03 DIAGNOSIS — Z8679 Personal history of other diseases of the circulatory system: Secondary | ICD-10-CM

## 2010-08-03 DIAGNOSIS — I4891 Unspecified atrial fibrillation: Secondary | ICD-10-CM

## 2010-08-03 DIAGNOSIS — N2889 Other specified disorders of kidney and ureter: Secondary | ICD-10-CM

## 2010-08-03 LAB — POCT INR: INR: 1.7

## 2010-08-04 ENCOUNTER — Emergency Department (HOSPITAL_COMMUNITY)
Admission: EM | Admit: 2010-08-04 | Discharge: 2010-08-04 | Disposition: A | Discharge status: Home / Self Care | Payer: Medicare Other | Attending: Emergency Medicine | Admitting: Emergency Medicine

## 2010-08-04 DIAGNOSIS — R109 Unspecified abdominal pain: Secondary | ICD-10-CM | POA: Insufficient documentation

## 2010-08-04 DIAGNOSIS — F411 Generalized anxiety disorder: Secondary | ICD-10-CM | POA: Insufficient documentation

## 2010-08-04 DIAGNOSIS — R110 Nausea: Secondary | ICD-10-CM | POA: Insufficient documentation

## 2010-08-04 DIAGNOSIS — I4891 Unspecified atrial fibrillation: Secondary | ICD-10-CM | POA: Insufficient documentation

## 2010-08-04 DIAGNOSIS — I2510 Atherosclerotic heart disease of native coronary artery without angina pectoris: Secondary | ICD-10-CM | POA: Insufficient documentation

## 2010-08-04 DIAGNOSIS — I10 Essential (primary) hypertension: Secondary | ICD-10-CM | POA: Insufficient documentation

## 2010-08-04 DIAGNOSIS — Z9889 Other specified postprocedural states: Secondary | ICD-10-CM | POA: Insufficient documentation

## 2010-08-04 LAB — COMPREHENSIVE METABOLIC PANEL WITH GFR
AST: 19 U/L (ref 0–37)
Albumin: 3.3 g/dL — ABNORMAL LOW (ref 3.5–5.2)
Alkaline Phosphatase: 74 U/L (ref 39–117)
BUN: 22 mg/dL (ref 6–23)
CO2: 24 meq/L (ref 19–32)
Chloride: 106 meq/L (ref 96–112)
GFR calc Af Amer: >60 mL/min (ref >60)
GFR calc non Af Amer: 50 mL/min — ABNORMAL LOW (ref >60)
Potassium: 5.2 meq/L — ABNORMAL HIGH (ref 3.5–5.1)
Total Bilirubin: 0.4 mg/dL (ref 0.3–1.2)

## 2010-08-04 LAB — COMPREHENSIVE METABOLIC PANEL
ALT: 12 U/L (ref 0–35)
Calcium: 9.4 mg/dL (ref 8.4–10.5)
Creatinine, Ser: 1.03 mg/dL (ref 0.4–1.2)
Glucose, Bld: 111 mg/dL — ABNORMAL HIGH (ref 70–99)
Sodium: 138 mEq/L (ref 135–145)
Total Protein: 6.6 g/dL (ref 6.0–8.3)

## 2010-08-04 LAB — POCT CARDIAC MARKERS
CKMB, poc: <1 ng/mL — ABNORMAL LOW (ref 1.0–8.0)
Myoglobin, poc: 63.9 ng/mL (ref 12–200)
Troponin i, poc: <0.05 ng/mL (ref 0.00–0.09)

## 2010-08-04 LAB — DIFFERENTIAL
Basophils Absolute: 0 K/uL (ref 0.0–0.1)
Basophils Relative: 1 % (ref 0–1)
Eosinophils Absolute: 0.4 10*3/uL (ref 0.0–0.7)
Eosinophils Relative: 4 % (ref 0–5)
Lymphocytes Relative: 12 % (ref 12–46)
Lymphs Abs: 1 10*3/uL (ref 0.7–4.0)
Monocytes Absolute: 0.6 K/uL (ref 0.1–1.0)
Monocytes Relative: 7 % (ref 3–12)
Neutro Abs: 6.4 K/uL (ref 1.7–7.7)
Neutrophils Relative %: 77 % (ref 43–77)

## 2010-08-04 LAB — URINALYSIS, ROUTINE W REFLEX MICROSCOPIC
Ketones, ur: 15 mg/dL — AB
Nitrite: NEGATIVE
Urobilinogen, UA: 1 mg/dL (ref 0.0–1.0)

## 2010-08-04 LAB — CBC
HCT: 28.3 % — ABNORMAL LOW (ref 36.0–46.0)
Hemoglobin: 9.5 g/dL — ABNORMAL LOW (ref 12.0–15.0)
MCH: 30.9 pg (ref 26.0–34.0)
MCHC: 33.6 g/dL (ref 30.0–36.0)
MCV: 92.2 fL (ref 78.0–100.0)
Platelets: 165 10*3/uL (ref 150–400)
RBC: 3.07 MIL/uL — ABNORMAL LOW (ref 3.87–5.11)
RDW: 15.7 % — ABNORMAL HIGH (ref 11.5–15.5)
WBC: 8.3 10*3/uL (ref 4.0–10.5)

## 2010-08-04 LAB — URINE MICROSCOPIC-ADD ON

## 2010-08-04 LAB — LIPASE, BLOOD: Lipase: 105 U/L — ABNORMAL HIGH (ref 11–59)

## 2010-08-04 LAB — LACTIC ACID, PLASMA: Lactic Acid, Venous: 2.1 mmol/L (ref 0.5–2.2)

## 2010-08-08 ENCOUNTER — Encounter (INDEPENDENT_AMBULATORY_CARE_PROVIDER_SITE_OTHER): Payer: Self-pay | Admitting: General Surgery

## 2010-08-17 ENCOUNTER — Ambulatory Visit (INDEPENDENT_AMBULATORY_CARE_PROVIDER_SITE_OTHER): Payer: Medicare Other | Admitting: *Deleted

## 2010-08-17 DIAGNOSIS — Z8679 Personal history of other diseases of the circulatory system: Secondary | ICD-10-CM

## 2010-08-17 DIAGNOSIS — N280 Ischemia and infarction of kidney: Secondary | ICD-10-CM

## 2010-08-17 DIAGNOSIS — I4891 Unspecified atrial fibrillation: Secondary | ICD-10-CM

## 2010-08-17 LAB — POCT INR: INR: 2.5

## 2010-09-23 ENCOUNTER — Ambulatory Visit (INDEPENDENT_AMBULATORY_CARE_PROVIDER_SITE_OTHER): Payer: Medicare Other | Admitting: *Deleted

## 2010-09-23 ENCOUNTER — Encounter: Payer: Self-pay | Admitting: Cardiology

## 2010-09-23 ENCOUNTER — Ambulatory Visit (INDEPENDENT_AMBULATORY_CARE_PROVIDER_SITE_OTHER): Payer: Medicare Other | Admitting: Cardiology

## 2010-09-23 DIAGNOSIS — N2889 Other specified disorders of kidney and ureter: Secondary | ICD-10-CM

## 2010-09-23 DIAGNOSIS — N280 Ischemia and infarction of kidney: Secondary | ICD-10-CM

## 2010-09-23 DIAGNOSIS — I2510 Atherosclerotic heart disease of native coronary artery without angina pectoris: Secondary | ICD-10-CM

## 2010-09-23 DIAGNOSIS — Z8679 Personal history of other diseases of the circulatory system: Secondary | ICD-10-CM

## 2010-09-23 DIAGNOSIS — I4891 Unspecified atrial fibrillation: Secondary | ICD-10-CM

## 2010-09-23 LAB — PROTIME-INR: INR: 10.3 ratio (ref 0.8–1.0)

## 2010-09-23 LAB — POCT INR: INR: 6.8

## 2010-09-23 MED ORDER — PHYTONADIONE 5 MG PO TABS: 2.5000 mg | ORAL_TABLET | Freq: Once | ORAL | Status: DC

## 2010-09-23 NOTE — Progress Notes (Signed)
HPI The patient is seen for cardiology followup.  She has both coronary disease and a significant embolus to her kidney.  Fortunately she is recovered from both.  She tolerates low-dose aspirin and Coumadin.  These are to be continued.She's not having any significant chest pain or shortness of breath. Allergies  Allergen Reactions  . Penicillins   . Sulfa Drugs Cross Reactors   . Sulfonamide Derivatives     Current Outpatient Prescriptions  Medication Sig Dispense Refill  . ALPRAZolam (XANAX) 0.5 MG tablet Take 0.5 mg by mouth 3 (three) times daily as needed.        Marland Kitchen aspirin 81 MG tablet Take 81 mg by mouth daily.        Marland Kitchen atorvastatin (LIPITOR) 10 MG tablet Take 10 mg by mouth daily.        Marland Kitchen diltiazem (CARDIZEM CD) 240 MG 24 hr capsule Take 240 mg by mouth daily.        . meloxicam (MOBIC) 7.5 MG tablet Take 7.5 mg by mouth as needed.        . metoprolol (LOPRESSOR) 50 MG tablet Take 50 mg by mouth 2 (two) times daily.        . nitroGLYCERIN (NITROSTAT) 0.4 MG SL tablet Place 0.4 mg under the tongue every 5 (five) minutes as needed.        . ondansetron (ZOFRAN) 4 MG tablet Take 4 mg by mouth every 8 (eight) hours as needed.        . potassium chloride (KLOR-CON) 10 MEQ CR tablet Take 10 mEq by mouth 2 (two) times daily.        Marland Kitchen warfarin (COUMADIN) 5 MG tablet Take by mouth as directed.        . zolpidem (AMBIEN) 5 MG tablet Take 5 mg by mouth at bedtime as needed.          History   Social History  . Marital Status: Single    Spouse Name: N/A    Number of Children: N/A  . Years of Education: N/A   Occupational History  . retired    Social History Main Topics  . Smoking status: Never Smoker   . Smokeless tobacco: Not on file  . Alcohol Use: No  . Drug Use: No  . Sexually Active: Not on file   Other Topics Concern  . Not on file   Social History Narrative  . No narrative on file    Family History  Problem Relation Age of Onset  . Hypertension      Past Medical  History  Diagnosis Date  . CAD (coronary artery disease)     Non-STEMI January, 2012.. bare-metal stents to ramus and circumflex, 80% small second diagonal to be followed  /   . Aortic stenosis, moderate     Moderate.. echo.. January 2 012  . Aortic insufficiency     Mild... echo.. January, 2012  . Mitral regurgitation     EF 60%... echo... January, 2012,  mild MR, severe annular calcification  . HTN (hypertension)   . Diverticulosis   . Pulmonary HTN     67 mmHg.. echo.. January, 2012  . Renal arterial thrombosis     January, 2012 while on aspirin and Plavix for coronary stents ( atrial fibrillation) Plavix stopped and Coumadin started  . Renal insufficiency     Creatinine 1.4.. April 29, 2010  . Anemia     Hemoglobin 9.7, February, 2012  . Cirrhosis of liver  appearance On CT, January, 2012  . Drug therapy     Plavix used for bare-metal stent stopped after several weeks when Coumadin and started for renal artery thrombus  . Atrial fibrillation     New diagnosis January, 2012, Coumadin not used initially because of Plavix and aspirin and age  for stents, renal artery thrombus, Plavix stopped Coumadin started April 23, 2010  . Drug therapy     Coumadin.Marland Kitchen started April 23, 2010 after renal artery embolus (atrial fibrillation)  . Renal mass     Low density, upper pole left kidney, CT January 2 012, etiology unclear, MRI can be considered  . Renal artery stenosis     Suspected from abdominal CT January, 2012.. significant    Past Surgical History  Procedure Date  . Total abdominal hysterectomy   . Dilation and curettage of uterus   . Stints January 2012    2 stints    ROS  Patient denies fever, chills, headache, sweats, rash, change in vision, change in hearing, chest pain, cough, nausea vomiting, urinary symptoms.  All other systems are reviewed and are negative.  PHYSICAL EXAM Patient is oriented to person time and place.  Affect is normal.  She is healed with  one of her helpers.  Head is atraumatic.  Lungs are clear.  Respiratory effort is nonlabored.  Cardiac exam reveals S1-S2.  There is a  Murmur of aortic stenosis.  Abdomen is soft.  There is no peripheral edema. Filed Vitals:   09/23/10 1039  BP: 122/68  Pulse: 68  Resp: 16  Height: 5\' 3"  (1.6 m)  Weight: 151 lb (68.493 kg)    EKG Is not done today. ASSESSMENT & PLAN

## 2010-09-23 NOTE — Patient Instructions (Signed)
Your physician wants you to follow-up in:  6 months. You will receive a reminder letter in the mail two months in advance. If you don't receive a letter, please call our office to schedule the follow-up appointment.   

## 2010-09-23 NOTE — Assessment & Plan Note (Signed)
Coronary disease is stable. No change in therapy. 

## 2010-09-23 NOTE — Assessment & Plan Note (Signed)
Patient is on Coumadin with no evidence return of emboli.  This is to be continued.

## 2010-09-26 ENCOUNTER — Ambulatory Visit (INDEPENDENT_AMBULATORY_CARE_PROVIDER_SITE_OTHER): Payer: Medicare Other | Admitting: *Deleted

## 2010-09-26 DIAGNOSIS — I4891 Unspecified atrial fibrillation: Secondary | ICD-10-CM

## 2010-09-26 DIAGNOSIS — Z8679 Personal history of other diseases of the circulatory system: Secondary | ICD-10-CM

## 2010-09-26 DIAGNOSIS — N280 Ischemia and infarction of kidney: Secondary | ICD-10-CM

## 2010-09-26 DIAGNOSIS — N2889 Other specified disorders of kidney and ureter: Secondary | ICD-10-CM

## 2010-09-26 LAB — POCT INR: INR: 1.8

## 2010-10-04 ENCOUNTER — Telehealth: Payer: Self-pay | Admitting: Cardiology

## 2010-10-04 NOTE — Telephone Encounter (Signed)
Per wilma - care-taker calling back regarding dentist today @ 10:30. Pt on coumadin.

## 2010-10-04 NOTE — Telephone Encounter (Signed)
Daughter aware ok for dentist while on coumadin and no need for antibiotics

## 2010-10-04 NOTE — Telephone Encounter (Signed)
Pt wants to know if it is all right to go to the dentist, considering pt is taking coumadin. Pt has dentist appt at 10 am this morning.

## 2010-10-10 ENCOUNTER — Encounter: Payer: Medicare Other | Admitting: *Deleted

## 2010-10-11 ENCOUNTER — Telehealth: Payer: Self-pay | Admitting: Cardiology

## 2010-10-11 NOTE — Telephone Encounter (Signed)
Called spoke with pt advised she would need to let DDS know she is on Coumadin and they will advise if she needs to hold for procedure.  If they wish for pt to hold Coumadin advised pt TCB and let us know so we could get clearance from Dr Myrtis Ser to hold Coumadin.  Pt agrees.

## 2010-10-11 NOTE — Telephone Encounter (Signed)
Pt's dentist want to either pull a tooth or capp it and she wants to know if that's a problem due to coumadin?

## 2010-10-13 ENCOUNTER — Ambulatory Visit (INDEPENDENT_AMBULATORY_CARE_PROVIDER_SITE_OTHER): Payer: Medicare Other | Admitting: *Deleted

## 2010-10-13 DIAGNOSIS — Z8679 Personal history of other diseases of the circulatory system: Secondary | ICD-10-CM

## 2010-10-13 DIAGNOSIS — I4891 Unspecified atrial fibrillation: Secondary | ICD-10-CM

## 2010-10-13 DIAGNOSIS — N280 Ischemia and infarction of kidney: Secondary | ICD-10-CM

## 2010-10-25 ENCOUNTER — Ambulatory Visit (INDEPENDENT_AMBULATORY_CARE_PROVIDER_SITE_OTHER): Payer: Medicare Other | Admitting: *Deleted

## 2010-10-25 ENCOUNTER — Other Ambulatory Visit: Payer: Self-pay | Admitting: Internal Medicine

## 2010-10-25 DIAGNOSIS — N280 Ischemia and infarction of kidney: Secondary | ICD-10-CM

## 2010-10-25 DIAGNOSIS — I4891 Unspecified atrial fibrillation: Secondary | ICD-10-CM

## 2010-10-25 DIAGNOSIS — Z8679 Personal history of other diseases of the circulatory system: Secondary | ICD-10-CM

## 2010-10-25 DIAGNOSIS — N2889 Other specified disorders of kidney and ureter: Secondary | ICD-10-CM

## 2010-11-08 ENCOUNTER — Other Ambulatory Visit: Payer: Self-pay | Admitting: Cardiovascular Disease

## 2010-11-15 ENCOUNTER — Ambulatory Visit (INDEPENDENT_AMBULATORY_CARE_PROVIDER_SITE_OTHER): Payer: Medicare Other | Admitting: *Deleted

## 2010-11-15 DIAGNOSIS — I4891 Unspecified atrial fibrillation: Secondary | ICD-10-CM

## 2010-11-15 DIAGNOSIS — N2889 Other specified disorders of kidney and ureter: Secondary | ICD-10-CM

## 2010-11-15 DIAGNOSIS — N280 Ischemia and infarction of kidney: Secondary | ICD-10-CM

## 2010-11-15 DIAGNOSIS — Z8679 Personal history of other diseases of the circulatory system: Secondary | ICD-10-CM

## 2010-11-15 LAB — POCT INR: INR: 2.3

## 2010-11-25 ENCOUNTER — Ambulatory Visit (INDEPENDENT_AMBULATORY_CARE_PROVIDER_SITE_OTHER): Payer: Medicare Other | Admitting: *Deleted

## 2010-11-25 DIAGNOSIS — N2889 Other specified disorders of kidney and ureter: Secondary | ICD-10-CM

## 2010-11-25 DIAGNOSIS — Z8679 Personal history of other diseases of the circulatory system: Secondary | ICD-10-CM

## 2010-11-25 DIAGNOSIS — N280 Ischemia and infarction of kidney: Secondary | ICD-10-CM

## 2010-11-25 DIAGNOSIS — I4891 Unspecified atrial fibrillation: Secondary | ICD-10-CM

## 2010-11-25 LAB — POCT INR: INR: 4.7

## 2010-12-06 ENCOUNTER — Ambulatory Visit (INDEPENDENT_AMBULATORY_CARE_PROVIDER_SITE_OTHER): Payer: Medicare Other | Admitting: *Deleted

## 2010-12-06 DIAGNOSIS — N2889 Other specified disorders of kidney and ureter: Secondary | ICD-10-CM

## 2010-12-06 DIAGNOSIS — N280 Ischemia and infarction of kidney: Secondary | ICD-10-CM

## 2010-12-06 DIAGNOSIS — I4891 Unspecified atrial fibrillation: Secondary | ICD-10-CM

## 2010-12-06 DIAGNOSIS — Z8679 Personal history of other diseases of the circulatory system: Secondary | ICD-10-CM

## 2010-12-18 ENCOUNTER — Other Ambulatory Visit: Payer: Self-pay | Admitting: Cardiovascular Disease

## 2010-12-20 ENCOUNTER — Ambulatory Visit (INDEPENDENT_AMBULATORY_CARE_PROVIDER_SITE_OTHER): Payer: Medicare Other | Admitting: *Deleted

## 2010-12-20 DIAGNOSIS — N2889 Other specified disorders of kidney and ureter: Secondary | ICD-10-CM

## 2010-12-20 DIAGNOSIS — Z8679 Personal history of other diseases of the circulatory system: Secondary | ICD-10-CM

## 2010-12-20 DIAGNOSIS — N280 Ischemia and infarction of kidney: Secondary | ICD-10-CM

## 2010-12-20 DIAGNOSIS — I4891 Unspecified atrial fibrillation: Secondary | ICD-10-CM

## 2011-01-10 ENCOUNTER — Ambulatory Visit (INDEPENDENT_AMBULATORY_CARE_PROVIDER_SITE_OTHER): Payer: Medicare Other | Admitting: *Deleted

## 2011-01-10 DIAGNOSIS — N280 Ischemia and infarction of kidney: Secondary | ICD-10-CM

## 2011-01-10 DIAGNOSIS — Z8679 Personal history of other diseases of the circulatory system: Secondary | ICD-10-CM

## 2011-01-10 DIAGNOSIS — I4891 Unspecified atrial fibrillation: Secondary | ICD-10-CM

## 2011-01-10 DIAGNOSIS — N2889 Other specified disorders of kidney and ureter: Secondary | ICD-10-CM

## 2011-01-10 LAB — POCT INR: INR: 2.2

## 2011-01-24 ENCOUNTER — Other Ambulatory Visit: Payer: Self-pay | Admitting: Internal Medicine

## 2011-02-07 ENCOUNTER — Ambulatory Visit (INDEPENDENT_AMBULATORY_CARE_PROVIDER_SITE_OTHER): Payer: Medicare Other | Admitting: *Deleted

## 2011-02-07 DIAGNOSIS — N2889 Other specified disorders of kidney and ureter: Secondary | ICD-10-CM

## 2011-02-07 DIAGNOSIS — Z8679 Personal history of other diseases of the circulatory system: Secondary | ICD-10-CM

## 2011-02-07 DIAGNOSIS — N280 Ischemia and infarction of kidney: Secondary | ICD-10-CM

## 2011-02-07 DIAGNOSIS — I4891 Unspecified atrial fibrillation: Secondary | ICD-10-CM

## 2011-02-07 DIAGNOSIS — Z7901 Long term (current) use of anticoagulants: Secondary | ICD-10-CM

## 2011-02-07 LAB — POCT INR: INR: 1.9

## 2011-03-07 ENCOUNTER — Encounter: Payer: Medicare Other | Admitting: *Deleted

## 2011-03-13 ENCOUNTER — Ambulatory Visit (INDEPENDENT_AMBULATORY_CARE_PROVIDER_SITE_OTHER): Payer: Medicare Other | Admitting: *Deleted

## 2011-03-13 ENCOUNTER — Ambulatory Visit: Payer: Medicare Other | Admitting: Cardiology

## 2011-03-13 DIAGNOSIS — N280 Ischemia and infarction of kidney: Secondary | ICD-10-CM

## 2011-03-13 DIAGNOSIS — N2889 Other specified disorders of kidney and ureter: Secondary | ICD-10-CM

## 2011-03-13 DIAGNOSIS — I4891 Unspecified atrial fibrillation: Secondary | ICD-10-CM

## 2011-03-13 DIAGNOSIS — Z8679 Personal history of other diseases of the circulatory system: Secondary | ICD-10-CM

## 2011-03-13 DIAGNOSIS — Z7901 Long term (current) use of anticoagulants: Secondary | ICD-10-CM

## 2011-03-29 ENCOUNTER — Ambulatory Visit (INDEPENDENT_AMBULATORY_CARE_PROVIDER_SITE_OTHER): Payer: Medicare Other | Admitting: *Deleted

## 2011-03-29 DIAGNOSIS — I4891 Unspecified atrial fibrillation: Secondary | ICD-10-CM

## 2011-03-29 DIAGNOSIS — Z7901 Long term (current) use of anticoagulants: Secondary | ICD-10-CM

## 2011-03-29 DIAGNOSIS — N280 Ischemia and infarction of kidney: Secondary | ICD-10-CM

## 2011-03-29 DIAGNOSIS — Z8679 Personal history of other diseases of the circulatory system: Secondary | ICD-10-CM

## 2011-03-29 DIAGNOSIS — N2889 Other specified disorders of kidney and ureter: Secondary | ICD-10-CM

## 2011-04-19 ENCOUNTER — Ambulatory Visit (INDEPENDENT_AMBULATORY_CARE_PROVIDER_SITE_OTHER): Payer: Medicare Other | Admitting: *Deleted

## 2011-04-19 DIAGNOSIS — N280 Ischemia and infarction of kidney: Secondary | ICD-10-CM

## 2011-04-19 DIAGNOSIS — I4891 Unspecified atrial fibrillation: Secondary | ICD-10-CM

## 2011-04-19 DIAGNOSIS — N2889 Other specified disorders of kidney and ureter: Secondary | ICD-10-CM

## 2011-04-19 DIAGNOSIS — Z7901 Long term (current) use of anticoagulants: Secondary | ICD-10-CM

## 2011-04-19 DIAGNOSIS — Z8679 Personal history of other diseases of the circulatory system: Secondary | ICD-10-CM

## 2011-04-19 LAB — POCT INR: INR: 2.7

## 2011-05-01 ENCOUNTER — Ambulatory Visit (INDEPENDENT_AMBULATORY_CARE_PROVIDER_SITE_OTHER): Payer: Medicare Other | Admitting: Cardiology

## 2011-05-01 ENCOUNTER — Encounter: Payer: Self-pay | Admitting: Cardiology

## 2011-05-01 VITALS — BP 158/82 | HR 71 | Ht 63.0 in | Wt 149.0 lb

## 2011-05-01 DIAGNOSIS — I2510 Atherosclerotic heart disease of native coronary artery without angina pectoris: Secondary | ICD-10-CM

## 2011-05-01 DIAGNOSIS — N2889 Other specified disorders of kidney and ureter: Secondary | ICD-10-CM

## 2011-05-01 DIAGNOSIS — N280 Ischemia and infarction of kidney: Secondary | ICD-10-CM

## 2011-05-01 DIAGNOSIS — I4891 Unspecified atrial fibrillation: Secondary | ICD-10-CM

## 2011-05-01 NOTE — Assessment & Plan Note (Signed)
Patient is on Coumadin. There is been no evidence of any recurrent thromboses or emboli.

## 2011-05-01 NOTE — Progress Notes (Signed)
HPI This delightful elderly lady is quite stable.In January, 2012 she had a non-STEMI and received a bare-metal stent. She has moderate aortic stenosis. 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 This occurred while she was on aspirin and Plavix for her coronary stents. For a period of time her Plavix was stopped and she was treated with Coumadin. Eventually she is now back on Coumadin along with low-dose aspirin. She's not having any chest pain or shortness of breath. She has mild peripheral edema.    Allergies  Allergen Reactions  . Penicillins   . Sulfa Drugs Cross Reactors   . Sulfonamide Derivatives     Current Outpatient Prescriptions  Medication Sig Dispense Refill  . ALPRAZolam (XANAX) 0.5 MG tablet Take 0.5 mg by mouth 3 (three) times daily as needed.        Marland Kitchen aspirin 81 MG tablet Take 81 mg by mouth daily.        Marland Kitchen atorvastatin (LIPITOR) 10 MG tablet TAKE 1 TABLET BY MOUTH EVERY DAY AT BEDTIME  30 tablet  6  . metoprolol (LOPRESSOR) 50 MG tablet TAKE 1 TABLET BY MOUTH TWICE A DAY  60 tablet  6  . nitroGLYCERIN (NITROSTAT) 0.4 MG SL tablet Place 0.4 mg under the tongue every 5 (five) minutes as needed.        . ondansetron (ZOFRAN) 4 MG tablet Take 4 mg by mouth every 8 (eight) hours as needed.        . potassium chloride (KLOR-CON) 10 MEQ CR tablet Take 10 mEq by mouth 2 (two) times daily.        Marland Kitchen warfarin (COUMADIN) 5 MG tablet Take as directed by anticoagulation clinic  30 tablet  3  . zolpidem (AMBIEN) 5 MG tablet Take 5 mg by mouth at bedtime as needed.          History   Social History  . Marital Status: Single    Spouse Name: N/A    Number of Children: N/A  . Years of Education: N/A   Occupational History  . retired    Social History Main Topics  . Smoking status: Never Smoker   . Smokeless tobacco: Not on file  . Alcohol Use: No  . Drug Use: No  . Sexually Active: Not on file   Other Topics Concern  . Not on file   Social History  Narrative  . No narrative on file    Family History  Problem Relation Age of Onset  . Hypertension      Past Medical History  Diagnosis Date  . CAD (coronary artery disease)     Non-STEMI January, 2012.. bare-metal stents to ramus and circumflex, 80% small second diagonal to be followed  /   . Aortic stenosis, moderate     Moderate.. echo.. January 2 012  . Aortic insufficiency     Mild... echo.. January, 2012  . Mitral regurgitation     EF 60%... echo... January, 2012,  mild MR, severe annular calcification  . HTN (hypertension)   . Diverticulosis   . Pulmonary HTN     67 mmHg.. echo.. January, 2012  . Renal arterial thrombosis     January, 2012 while on aspirin and Plavix for coronary stents ( atrial fibrillation) Plavix stopped and Coumadin started  . Renal insufficiency     Creatinine 1.4.. April 29, 2010  . Anemia     Hemoglobin 9.7, February, 2012  . Cirrhosis of liver  appearance On CT, January, 2012  . Drug therapy     Plavix used for bare-metal stent stopped after several weeks when Coumadin and started for renal artery thrombus  . Atrial fibrillation     New diagnosis January, 2012, Coumadin not used initially because of Plavix and aspirin and age  for stents, renal artery thrombus, Plavix stopped Coumadin started April 23, 2010  . Drug therapy     Coumadin.Marland Kitchen started April 23, 2010 after renal artery embolus (atrial fibrillation)  . Renal mass     Low density, upper pole left kidney, CT January 2 012, etiology unclear, MRI can be considered  . Renal artery stenosis     Suspected from abdominal CT January, 2012.. significant    Past Surgical History  Procedure Date  . Total abdominal hysterectomy   . Dilation and curettage of uterus   . Stints January 2012    2 stints    ROS  Patient denies fever, chills, headache, sweats, rash, change in vision, change in hearing, chest pain, cough, nausea vomiting, urinary symptoms. All other systems are  reviewed and are negative.  PHYSICAL EXAM Patient is oriented to person time and place. She is here with her caregiver. Lungs are clear. Respiratory effort is nonlabored. There is no jugular venous distention. Cardiac exam reveals her murmur of moderate aortic stenosis. The abdomen is soft. She does have 1+ peripheral edema.  Filed Vitals:   05/01/11 1022  BP: 158/82  Pulse: 71  Height: 5\' 3"  (1.6 m)  Weight: 149 lb (67.586 kg)    EKG EKG is done today and reviewed by me. She has atrial flutter with a controlled rate.  ASSESSMENT & PLAN

## 2011-05-01 NOTE — Patient Instructions (Signed)
Your physician wants you to follow-up in:  6 months. You will receive a reminder letter in the mail two months in advance. If you don't receive a letter, please call our office to schedule the follow-up appointment.   

## 2011-05-01 NOTE — Assessment & Plan Note (Signed)
Coronary disease is stable. She needs no further workup at this time.

## 2011-05-01 NOTE — Assessment & Plan Note (Signed)
Atrial fib is chronic. The rate is controlled. She is on Coumadin. No change in therapy.

## 2011-05-17 ENCOUNTER — Ambulatory Visit (INDEPENDENT_AMBULATORY_CARE_PROVIDER_SITE_OTHER): Payer: Medicare Other | Admitting: *Deleted

## 2011-05-17 DIAGNOSIS — Z8679 Personal history of other diseases of the circulatory system: Secondary | ICD-10-CM

## 2011-05-17 DIAGNOSIS — I4891 Unspecified atrial fibrillation: Secondary | ICD-10-CM

## 2011-05-17 DIAGNOSIS — Z7901 Long term (current) use of anticoagulants: Secondary | ICD-10-CM

## 2011-05-17 DIAGNOSIS — N2889 Other specified disorders of kidney and ureter: Secondary | ICD-10-CM

## 2011-05-17 DIAGNOSIS — N280 Ischemia and infarction of kidney: Secondary | ICD-10-CM

## 2011-05-19 ENCOUNTER — Other Ambulatory Visit: Payer: Self-pay | Admitting: Internal Medicine

## 2011-06-01 ENCOUNTER — Other Ambulatory Visit: Payer: Self-pay | Admitting: Cardiology

## 2011-06-01 MED ORDER — ATORVASTATIN CALCIUM 10 MG PO TABS: 10.0000 mg | ORAL_TABLET | Freq: Every day | ORAL | Status: DC

## 2011-06-06 ENCOUNTER — Ambulatory Visit (INDEPENDENT_AMBULATORY_CARE_PROVIDER_SITE_OTHER): Payer: Medicare Other | Admitting: *Deleted

## 2011-06-06 DIAGNOSIS — N2889 Other specified disorders of kidney and ureter: Secondary | ICD-10-CM

## 2011-06-06 DIAGNOSIS — Z7901 Long term (current) use of anticoagulants: Secondary | ICD-10-CM

## 2011-06-06 DIAGNOSIS — I4891 Unspecified atrial fibrillation: Secondary | ICD-10-CM

## 2011-06-06 DIAGNOSIS — Z8679 Personal history of other diseases of the circulatory system: Secondary | ICD-10-CM

## 2011-06-06 DIAGNOSIS — N280 Ischemia and infarction of kidney: Secondary | ICD-10-CM

## 2011-06-28 ENCOUNTER — Other Ambulatory Visit: Payer: Self-pay | Admitting: *Deleted

## 2011-06-28 MED ORDER — WARFARIN SODIUM 5 MG PO TABS: ORAL_TABLET | ORAL | Status: DC

## 2011-10-18 ENCOUNTER — Ambulatory Visit (INDEPENDENT_AMBULATORY_CARE_PROVIDER_SITE_OTHER): Payer: Medicare Other | Admitting: Internal Medicine

## 2011-10-18 ENCOUNTER — Encounter: Payer: Self-pay | Admitting: Internal Medicine

## 2011-10-18 ENCOUNTER — Other Ambulatory Visit (INDEPENDENT_AMBULATORY_CARE_PROVIDER_SITE_OTHER): Payer: Medicare Other

## 2011-10-18 VITALS — BP 126/72 | HR 100 | Ht 63.0 in | Wt 143.0 lb

## 2011-10-18 DIAGNOSIS — R198 Other specified symptoms and signs involving the digestive system and abdomen: Secondary | ICD-10-CM

## 2011-10-18 DIAGNOSIS — N189 Chronic kidney disease, unspecified: Secondary | ICD-10-CM

## 2011-10-18 DIAGNOSIS — D508 Other iron deficiency anemias: Secondary | ICD-10-CM

## 2011-10-18 DIAGNOSIS — N039 Chronic nephritic syndrome with unspecified morphologic changes: Secondary | ICD-10-CM

## 2011-10-18 DIAGNOSIS — R109 Unspecified abdominal pain: Secondary | ICD-10-CM

## 2011-10-18 DIAGNOSIS — D631 Anemia in chronic kidney disease: Secondary | ICD-10-CM

## 2011-10-18 DIAGNOSIS — R194 Change in bowel habit: Secondary | ICD-10-CM

## 2011-10-18 LAB — CBC WITH DIFFERENTIAL/PLATELET
Basophils Absolute: 0 10*3/uL (ref 0.0–0.1)
Eosinophils Relative: 6.7 % — ABNORMAL HIGH (ref 0.0–5.0)
Lymphocytes Relative: 8.4 % — ABNORMAL LOW (ref 12.0–46.0)
Monocytes Relative: 7.2 % (ref 3.0–12.0)
Neutrophils Relative %: 77.1 % — ABNORMAL HIGH (ref 43.0–77.0)
Platelets: 186 10*3/uL (ref 150.0–400.0)
WBC: 8.5 10*3/uL (ref 4.5–10.5)

## 2011-10-18 LAB — COMPREHENSIVE METABOLIC PANEL
Albumin: 3.5 g/dL (ref 3.5–5.2)
Alkaline Phosphatase: 68 U/L (ref 39–117)
BUN: 27 mg/dL — ABNORMAL HIGH (ref 6–23)
CO2: 25 mEq/L (ref 19–32)
Calcium: 8.8 mg/dL (ref 8.4–10.5)
GFR: 39.03 mL/min — ABNORMAL LOW (ref >60.00)
Glucose, Bld: 109 mg/dL — ABNORMAL HIGH (ref 70–99)
Potassium: 4.6 mEq/L (ref 3.5–5.1)

## 2011-10-18 LAB — FERRITIN: Ferritin: 69.2 ng/mL (ref 10.0–291.0)

## 2011-10-18 LAB — IBC PANEL
Iron: 55 ug/dL (ref 42–145)
Transferrin: 258.3 mg/dL (ref 212.0–360.0)

## 2011-10-18 MED ORDER — POLYETHYLENE GLYCOL 3350 17 GM/SCOOP PO POWD: 17.0000 g | Freq: Every day | ORAL | Status: AC

## 2011-10-18 NOTE — Progress Notes (Signed)
Patient ID: [name], female   DOB: 1918-04-04, 78 y.o.   MRN: [id number]  SUBJECTIVE: HPI [name] is a 78 yo female with PMH of CAD, aortic stenosis, mitral regurg, hypertension, renal artery thrombosis, atrial fibrillation who is seen in consultation at the request of Marin Comment, FNP for evaluation of constipation. The patient is accompanied today by her medical aid.  The patient reports several months of worsening constipation. She reports an uncomfortable and "yucky" feeling across her abdomen. Regarding her constipation, this is a long-standing issue and she has used over-the-counter bisacodyl for many years. She is also been using docusate and occasionally milk of magnesia. Recently, over last 1-2 months, she reports feeling like she is unable to completely evacuate her rectum. This has required the use of enemas on 2 occasions in the past several weeks. She also reports loose stool which leaks around hard stool. She denies rectal bleeding. No melena. She does report a feeling of incomplete evacuation. She has not required manual disimpaction. She was previously using a fiber supplement which was stopped approximately 2 days ago. She also reports occasional nausea without vomiting. The "yucky" feeling can occur in her upper abdomen and is not necessarily associated with eating. She does report upper and lower gas frequently.  Her appetite is "fair" and she denies heartburn and dysphagia. Her weight has been fairly stable, perhaps down 5-6 pounds in the last 1-2 months. On talking to her health aid, she reports the patient becomes very stressed and anxious if she does not have a bowel movement daily.  They report FOBT testing which they plan to do in the next couple of days. This was ordered by her primary care office. She was also instructed to take iron, which she has not started.  Review of Systems  As per history of present illness, otherwise negative   Past Medical History  Diagnosis Date   . CAD (coronary artery disease)     Non-STEMI January, 2012.. bare-metal stents to ramus and circumflex, 80% small second diagonal to be followed  /   . Aortic stenosis, moderate     Moderate.. echo.. January 2 012  . Aortic insufficiency     Mild... echo.. January, 2012  . Mitral regurgitation     EF 60%... echo... January, 2012,  mild MR, severe annular calcification  . HTN (hypertension)   . Diverticulosis   . Pulmonary HTN     67 mmHg.. echo.. January, 2012  . Renal arterial thrombosis     January, 2012 while on aspirin and Plavix for coronary stents ( atrial fibrillation) Plavix stopped and Coumadin started  . Renal insufficiency     Creatinine 1.4.. April 29, 2010  . Anemia     Hemoglobin 9.7, February, 2012  . Cirrhosis of liver     appearance On CT, January, 2012  . Drug therapy     Plavix used for bare-metal stent stopped after several weeks when Coumadin and started for renal artery thrombus  . Atrial fibrillation     New diagnosis January, 2012, Coumadin not used initially because of Plavix and aspirin and age  for stents, renal artery thrombus, Plavix stopped Coumadin started April 23, 2010  . Drug therapy     Coumadin.Marland Kitchen started April 23, 2010 after renal artery embolus (atrial fibrillation)  . Renal mass     Low density, upper pole left kidney, CT January 2 012, etiology unclear, MRI can be considered  . Renal artery stenosis  Suspected from abdominal CT January, 2012.. significant    Current Outpatient Prescriptions  Medication Sig Dispense Refill  . ALPRAZolam (XANAX) 0.5 MG tablet Take 0.5 mg by mouth 3 (three) times daily as needed.        Marland Kitchen aspirin 81 MG tablet Take 81 mg by mouth daily.        Marland Kitchen atorvastatin (LIPITOR) 10 MG tablet Take 1 tablet (10 mg total) by mouth daily.  30 tablet  6  . diltiazem (CARDIZEM CD) 240 MG 24 hr capsule TAKE ONE CAPSULE BY MOUTH EVERY DAY  30 capsule  5  . metoprolol (LOPRESSOR) 50 MG tablet TAKE 1 TABLET BY MOUTH  TWICE A DAY  60 tablet  6  . nitroGLYCERIN (NITROSTAT) 0.4 MG SL tablet Place 0.4 mg under the tongue every 5 (five) minutes as needed.        . ondansetron (ZOFRAN) 4 MG tablet Take 4 mg by mouth every 8 (eight) hours as needed.        . OXYCODONE HCL PO Take by mouth as needed.      . potassium chloride (KLOR-CON) 10 MEQ CR tablet Take 10 mEq by mouth 2 (two) times daily.        Marland Kitchen warfarin (COUMADIN) 5 MG tablet Take as directed by anticoagulation clinic  30 tablet  3  . zolpidem (AMBIEN) 5 MG tablet Take 5 mg by mouth at bedtime as needed.        . polyethylene glycol powder (GLYCOLAX/MIRALAX) powder Take 17 g by mouth daily.  255 g  3    Allergies  Allergen Reactions  . Penicillins   . Sulfa Drugs Cross Reactors   . Sulfonamide Derivatives     Family History  Problem Relation Age of Onset  . Hypertension      History  Substance Use Topics  . Smoking status: Never Smoker   . Smokeless tobacco: Not on file  . Alcohol Use: No    OBJECTIVE: BP 126/72  Pulse 100  Ht 5\' 3"  (1.6 m)  Wt 143 lb (64.864 kg)  BMI 25.33 kg/m2 Constitutional: Well-developed and well-nourished, elderly appearing in NAD HEENT: Normocephalic and atraumatic. Oropharynx is clear and moist. No oropharyngeal exudate. Conjunctivae are normal.  No scleral icterus. Neck: Neck supple. Trachea midline. Cardiovascular: Normal rate, regular rhythm and intact distal pulses. 2/6 SEM Pulmonary/chest: Effort normal and breath sounds normal. No wheezing, rales or rhonchi. Abdominal: Soft, nontender, nondistended. Bowel sounds active throughout. There are no masses palpable. No hepatosplenomegaly. Rectal: No masses, no external hemorrhoids, formed brown stool in the rectal vault, normal tone Extremities: Trace edema left lower extremity, no clubbing or cyanosis Lymphadenopathy: No cervical adenopathy noted. Neurological: Alert and oriented to person place and time. Skin: Skin is warm and dry. No rashes  noted. Psychiatric: Normal mood and affect. Behavior is normal.  Labs and Imaging -- Labs dated 10/17/2011 Sodium 139, potassium 4.3, chloride 105, carbon dioxide 25, BUN 18, creatinine 1.14, glucose 106 Total protein 6.4, albumin 3.9, globulin 2.5, total bili 0.8, alkaline phosphatase 79, AST 18, ALT 12 WBC 6.5, hemoglobin 10.4, hematocrit 32.1, MCV 98.7, platelet count 186 CEA 1.7  Complete abdominal ultrasound 10/16/2011 Impression 1. No gallstones are noted within the gallbladder. Normal CBD 2. No hydronephrosis. There is probable nonobstructive calculus in upper pole of left kidney measures 8 mm. A cyst in upper pole of left kidney measures 10 x 8 mm 3. No aortic aneurysm  ASSESSMENT AND PLAN:  78 yo female with  PMH of CAD, aortic stenosis, mitral regurg, hypertension, renal artery thrombosis, atrial fibrillation who is seen in consultation at the request of Marin Comment, FNP for evaluation of constipation.  1. Constipation -- the patient has a somewhat long-standing history of constipation, but over the last several weeks it has been worse for her. She has been on laxatives for years. It is possible that her bloating and upper abdominal discomfort is secondary to constipation, but I will order an abdominal CT scan with contrast for further evaluation. For now I like to start her on MiraLAX 17 g daily. She will take 2 doses today and then one dose daily thereafter. I would like for her to hold on taking further bisacodyl for now. I also want her to continue to hold her fiber supplementation as this can be bloating. I will also check CBC and iron studies today. We have discussed endoscopy, but given that this is invasive and she is 21, I would like to try conservative measures first unless alarm symptoms are discovered/occur.  I will see her back in one months time.   Further recommendations after imaging and we will discuss further at followup

## 2011-10-18 NOTE — Patient Instructions (Addendum)
Your physician has requested that you go to the basement forlab work before leaving today.  You have been scheduled for a CT scan of the abdomen and pelvis at [facility] ([address]---this is in the same building as [facility]).   You are scheduled on 10/19/2011 at 2:00pm. You should arrive 15 minutes prior to your appointment time for registration. Please follow the written instructions below on the day of your exam:  WARNING: IF YOU ARE ALLERGIC TO IODINE/X-RAY DYE, PLEASE NOTIFY RADIOLOGY IMMEDIATELY AT [phone]! YOU WILL BE GIVEN A 13 HOUR PREMEDICATION PREP.  1) Do not eat or drink anything after 10:00am (4 hours prior to your test) 2) You have been given 2 bottles of oral contrast to drink. The solution may taste better if refrigerated, but do NOT add ice or any other liquid to this solution. Shake well before drinking.    Drink 1 bottle of contrast @ 12:00 (2 hours prior to your exam)  Drink 1 bottle of contrast @ 1:00 (1 hour prior to your exam)  You may take any medications as prescribed with a small amount of water except for the following: Metformin, Glucophage, Glucovance, Avandamet, Riomet, Fortamet, Actoplus Met, Janumet, Glumetza or Metaglip. The above medications must be held the day of the exam AND 48 hours after the exam.  The purpose of you drinking the oral contrast is to aid in the visualization of your intestinal tract. The contrast solution may cause some diarrhea. Before your exam is started, you will be given a small amount of fluid to drink. Depending on your individual set of symptoms, you may also receive an intravenous injection of x-ray contrast/dye. Plan on being at [facility] for 30 minutes or long, depending on the type of exam you are having performed.  If you have any questions regarding your exam or if you need to reschedule, you may call the CT department at [phone] between the hours of 8:00 am and 5:00 pm,  Monday-Friday.  ________________________________________________________________________

## 2011-10-19 ENCOUNTER — Ambulatory Visit (INDEPENDENT_AMBULATORY_CARE_PROVIDER_SITE_OTHER)
Admission: RE | Admit: 2011-10-19 | Discharge: 2011-10-19 | Disposition: A | Discharge status: Home / Self Care | Payer: Medicare Other | Source: Ambulatory Visit | Attending: Internal Medicine | Admitting: Internal Medicine

## 2011-10-19 DIAGNOSIS — R109 Unspecified abdominal pain: Secondary | ICD-10-CM

## 2011-10-19 DIAGNOSIS — R198 Other specified symptoms and signs involving the digestive system and abdomen: Secondary | ICD-10-CM

## 2011-10-19 DIAGNOSIS — R194 Change in bowel habit: Secondary | ICD-10-CM

## 2011-10-19 MED ORDER — IOHEXOL 300 MG/ML  SOLN
80.0000 mL | Freq: Once | INTRAMUSCULAR | Status: AC | PRN
  Administered 2011-10-19: 80 mL via INTRAVENOUS

## 2011-10-21 ENCOUNTER — Other Ambulatory Visit: Payer: Self-pay | Admitting: Cardiology

## 2011-10-23 ENCOUNTER — Telehealth: Payer: Self-pay | Admitting: Internal Medicine

## 2011-10-23 ENCOUNTER — Telehealth: Payer: Self-pay | Admitting: *Deleted

## 2011-10-23 NOTE — Telephone Encounter (Signed)
Spoke with patient and gave her lab and CT results

## 2011-10-23 NOTE — Telephone Encounter (Signed)
Received a call from [facility] (PCP) that patient had +blood in stool. They will fax [place] the results. Also, faxed them results of CT and labs down in our [phone])

## 2011-10-26 NOTE — Telephone Encounter (Signed)
Pt is scheduled for OV with Dr. Rhea Belton for 11/15/11@3pm , letter was mailed to pt regarding appt date and time.

## 2011-10-26 NOTE — Telephone Encounter (Signed)
Await results.  We can discuss this further in followup which was scheduled 1 month from last visit. Pls ensure FU OV is scheduled.

## 2011-10-31 ENCOUNTER — Encounter (HOSPITAL_COMMUNITY): Payer: Self-pay | Admitting: Emergency Medicine

## 2011-10-31 ENCOUNTER — Inpatient Hospital Stay (HOSPITAL_COMMUNITY)
Admission: EM | Admit: 2011-10-31 | Discharge: 2011-11-04 | DRG: 377 | Disposition: A | Discharge status: Home / Self Care | Payer: Medicare Other | Attending: Family Medicine | Admitting: Family Medicine

## 2011-10-31 DIAGNOSIS — Z66 Do not resuscitate: Secondary | ICD-10-CM | POA: Diagnosis present

## 2011-10-31 DIAGNOSIS — I252 Old myocardial infarction: Secondary | ICD-10-CM

## 2011-10-31 DIAGNOSIS — D6959 Other secondary thrombocytopenia: Secondary | ICD-10-CM | POA: Diagnosis present

## 2011-10-31 DIAGNOSIS — K7460 Unspecified cirrhosis of liver: Secondary | ICD-10-CM | POA: Diagnosis present

## 2011-10-31 DIAGNOSIS — I351 Nonrheumatic aortic (valve) insufficiency: Secondary | ICD-10-CM

## 2011-10-31 DIAGNOSIS — I350 Nonrheumatic aortic (valve) stenosis: Secondary | ICD-10-CM

## 2011-10-31 DIAGNOSIS — N2889 Other specified disorders of kidney and ureter: Secondary | ICD-10-CM

## 2011-10-31 DIAGNOSIS — Z79899 Other long term (current) drug therapy: Secondary | ICD-10-CM

## 2011-10-31 DIAGNOSIS — K921 Melena: Secondary | ICD-10-CM | POA: Diagnosis present

## 2011-10-31 DIAGNOSIS — K5790 Diverticulosis of intestine, part unspecified, without perforation or abscess without bleeding: Secondary | ICD-10-CM

## 2011-10-31 DIAGNOSIS — D62 Acute posthemorrhagic anemia: Secondary | ICD-10-CM | POA: Diagnosis present

## 2011-10-31 DIAGNOSIS — I701 Atherosclerosis of renal artery: Secondary | ICD-10-CM

## 2011-10-31 DIAGNOSIS — R791 Abnormal coagulation profile: Secondary | ICD-10-CM | POA: Diagnosis present

## 2011-10-31 DIAGNOSIS — I272 Pulmonary hypertension, unspecified: Secondary | ICD-10-CM

## 2011-10-31 DIAGNOSIS — D689 Coagulation defect, unspecified: Secondary | ICD-10-CM

## 2011-10-31 DIAGNOSIS — K811 Chronic cholecystitis: Secondary | ICD-10-CM | POA: Diagnosis present

## 2011-10-31 DIAGNOSIS — Z7901 Long term (current) use of anticoagulants: Secondary | ICD-10-CM

## 2011-10-31 DIAGNOSIS — I359 Nonrheumatic aortic valve disorder, unspecified: Secondary | ICD-10-CM | POA: Diagnosis present

## 2011-10-31 DIAGNOSIS — K5900 Constipation, unspecified: Secondary | ICD-10-CM | POA: Diagnosis not present

## 2011-10-31 DIAGNOSIS — I340 Nonrheumatic mitral (valve) insufficiency: Secondary | ICD-10-CM

## 2011-10-31 DIAGNOSIS — I2510 Atherosclerotic heart disease of native coronary artery without angina pectoris: Secondary | ICD-10-CM | POA: Diagnosis present

## 2011-10-31 DIAGNOSIS — K922 Gastrointestinal hemorrhage, unspecified: Secondary | ICD-10-CM

## 2011-10-31 DIAGNOSIS — I4891 Unspecified atrial fibrillation: Secondary | ICD-10-CM | POA: Diagnosis present

## 2011-10-31 DIAGNOSIS — I059 Rheumatic mitral valve disease, unspecified: Secondary | ICD-10-CM | POA: Diagnosis present

## 2011-10-31 DIAGNOSIS — Z880 Allergy status to penicillin: Secondary | ICD-10-CM

## 2011-10-31 DIAGNOSIS — I2789 Other specified pulmonary heart diseases: Secondary | ICD-10-CM

## 2011-10-31 DIAGNOSIS — I080 Rheumatic disorders of both mitral and aortic valves: Secondary | ICD-10-CM

## 2011-10-31 DIAGNOSIS — K5731 Diverticulosis of large intestine without perforation or abscess with bleeding: Principal | ICD-10-CM | POA: Diagnosis present

## 2011-10-31 DIAGNOSIS — D649 Anemia, unspecified: Secondary | ICD-10-CM

## 2011-10-31 DIAGNOSIS — N289 Disorder of kidney and ureter, unspecified: Secondary | ICD-10-CM | POA: Diagnosis present

## 2011-10-31 DIAGNOSIS — R578 Other shock: Secondary | ICD-10-CM | POA: Diagnosis not present

## 2011-10-31 DIAGNOSIS — N280 Ischemia and infarction of kidney: Secondary | ICD-10-CM

## 2011-10-31 DIAGNOSIS — Z86718 Personal history of other venous thrombosis and embolism: Secondary | ICD-10-CM

## 2011-10-31 DIAGNOSIS — N189 Chronic kidney disease, unspecified: Secondary | ICD-10-CM

## 2011-10-31 DIAGNOSIS — I10 Essential (primary) hypertension: Secondary | ICD-10-CM | POA: Diagnosis present

## 2011-10-31 LAB — COMPREHENSIVE METABOLIC PANEL
ALT: 18 U/L (ref 0–35)
AST: 18 U/L (ref 0–37)
CO2: 23 mEq/L (ref 19–32)
Calcium: 8.6 mg/dL (ref 8.4–10.5)
GFR calc non Af Amer: 48 mL/min — ABNORMAL LOW (ref >90)
Potassium: 4.5 mEq/L (ref 3.5–5.1)
Sodium: 138 mEq/L (ref 135–145)

## 2011-10-31 LAB — CBC WITH DIFFERENTIAL/PLATELET
Basophils Absolute: 0 10*3/uL (ref 0.0–0.1)
Basophils Relative: 0 % (ref 0–1)
Eosinophils Relative: 5 % (ref 0–5)
HCT: 30.7 % — ABNORMAL LOW (ref 36.0–46.0)
Hemoglobin: 9.8 g/dL — ABNORMAL LOW (ref 12.0–15.0)
Lymphocytes Relative: 9 % — ABNORMAL LOW (ref 12–46)
Monocytes Relative: 5 % (ref 3–12)
Neutro Abs: 9.4 10*3/uL — ABNORMAL HIGH (ref 1.7–7.7)
RBC: 3.15 MIL/uL — ABNORMAL LOW (ref 3.87–5.11)
RDW: 16.2 % — ABNORMAL HIGH (ref 11.5–15.5)
WBC: 11.7 10*3/uL — ABNORMAL HIGH (ref 4.0–10.5)

## 2011-10-31 LAB — ABO/RH: ABO/RH(D): A POS

## 2011-10-31 LAB — APTT: aPTT: 38 seconds — ABNORMAL HIGH (ref 24–37)

## 2011-10-31 LAB — GLUCOSE, CAPILLARY: Glucose-Capillary: 128 mg/dL — ABNORMAL HIGH (ref 70–99)

## 2011-10-31 MED ORDER — ZOLPIDEM TARTRATE 5 MG PO TABS
5.0000 mg | ORAL_TABLET | Freq: Every evening | ORAL | Status: DC | PRN
  Administered 2011-10-31 – 2011-11-03 (×4): 5 mg via ORAL
  Filled 2011-10-31 (×4): qty 1

## 2011-10-31 MED ORDER — ONDANSETRON HCL 4 MG PO TABS: 4.0000 mg | ORAL_TABLET | Freq: Four times a day (QID) | ORAL | Status: DC | PRN

## 2011-10-31 MED ORDER — ONDANSETRON HCL 4 MG/2ML IJ SOLN: 4.0000 mg | Freq: Four times a day (QID) | INTRAMUSCULAR | Status: DC | PRN

## 2011-10-31 MED ORDER — HYDROMORPHONE HCL PF 1 MG/ML IJ SOLN
0.5000 mg | Freq: Once | INTRAMUSCULAR | Status: AC
  Administered 2011-10-31: 0.5 mg via INTRAVENOUS
  Filled 2011-10-31: qty 1

## 2011-10-31 MED ORDER — SODIUM CHLORIDE 0.9 % IJ SOLN
3.0000 mL | Freq: Two times a day (BID) | INTRAMUSCULAR | Status: DC
  Administered 2011-10-31 – 2011-11-04 (×5): 3 mL via INTRAVENOUS

## 2011-10-31 MED ORDER — SODIUM CHLORIDE 0.9 % IV SOLN
INTRAVENOUS | Status: AC
  Administered 2011-10-31: 18:00:00 via INTRAVENOUS

## 2011-10-31 MED ORDER — NITROGLYCERIN 0.4 MG SL SUBL: 0.4000 mg | SUBLINGUAL_TABLET | SUBLINGUAL | Status: DC | PRN

## 2011-10-31 MED ORDER — VITAMIN K1 10 MG/ML IJ SOLN: 5.0000 mg | Freq: Once | INTRAVENOUS | Status: DC

## 2011-10-31 MED ORDER — ALPRAZOLAM 0.5 MG PO TABS
0.5000 mg | ORAL_TABLET | Freq: Three times a day (TID) | ORAL | Status: DC | PRN
  Administered 2011-11-01 – 2011-11-04 (×2): 0.5 mg via ORAL
  Filled 2011-10-31 (×2): qty 1

## 2011-10-31 MED ORDER — SODIUM CHLORIDE 0.9 % IV SOLN
INTRAVENOUS | Status: DC
  Administered 2011-11-01: 75 mL/h via INTRAVENOUS
  Administered 2011-11-02: 100 mL/h via INTRAVENOUS
  Administered 2011-11-02 – 2011-11-03 (×2): via INTRAVENOUS

## 2011-10-31 MED ORDER — SODIUM CHLORIDE 0.9 % IV SOLN
Freq: Once | INTRAVENOUS | Status: AC
  Administered 2011-10-31: 08:00:00 via INTRAVENOUS

## 2011-10-31 MED ORDER — PANTOPRAZOLE SODIUM 40 MG IV SOLR
40.0000 mg | Freq: Two times a day (BID) | INTRAVENOUS | Status: DC
  Filled 2011-10-31 (×2): qty 40

## 2011-10-31 MED ORDER — DILTIAZEM HCL ER COATED BEADS 240 MG PO CP24
240.0000 mg | ORAL_CAPSULE | Freq: Every day | ORAL | Status: DC
  Filled 2011-10-31: qty 1

## 2011-10-31 MED ORDER — VITAMIN K1 10 MG/ML IJ SOLN
5.0000 mg | Freq: Once | INTRAMUSCULAR | Status: AC
  Administered 2011-10-31: 5 mg via SUBCUTANEOUS
  Filled 2011-10-31: qty 0.5

## 2011-10-31 MED ORDER — PANTOPRAZOLE SODIUM 40 MG IV SOLR
40.0000 mg | INTRAVENOUS | Status: DC
  Administered 2011-10-31 – 2011-11-03 (×4): 40 mg via INTRAVENOUS
  Filled 2011-10-31 (×5): qty 40

## 2011-10-31 MED ORDER — SODIUM CHLORIDE 0.9 % IV SOLN: INTRAVENOUS | Status: AC

## 2011-10-31 NOTE — Significant Event (Signed)
Rapid Response Event Note  Overview: Time Called: 1652 Arrival Time: 1655 Event Type: Other (Comment) (vagel, blood loss)  Initial Focused Assessment: Called to room 1430 after RN reports vagel reaction when pt OOB to [facility] and has maroon liquid stool approximately   Interventions:MD at bedside , see orders  Event Summary: Transferred to SD for fluids and blood, Pt conversing with staff, neuro assessment intact. Family updated by MD   at      at          [facility], Ferd Hibbs

## 2011-10-31 NOTE — Progress Notes (Signed)
Responded to rapid response Room 1430.   Provided support to family in hallway during rapid response event.  Provided support to pt and family during transition to ICU stepdown.  Will follow up with pt in stepdown unit.   Belva Crome  MDiv, Chaplain

## 2011-10-31 NOTE — ED Notes (Signed)
MD at bedside. 

## 2011-10-31 NOTE — ED Notes (Signed)
hospitalist at bedside

## 2011-10-31 NOTE — H&P (Signed)
[facility] History and Physical  [name] JXB:[id number] DOB: 05/25/1918 DOA: 10/31/2011  Referring physician: Bruce Donath PCP: Abigail Miyamoto, MD   Chief Complaint: BRBPR  HPI:  [name] is a delightful 78 yo with a medical hx that includes CAD, Afib, Mitral regurg, aortic stenosis, renal arterial thrombosis, diverticulosis HTN who presents to ED this am cc BRBPR. Denies abdominal pain/nausea/vomiting. Reports feeling a little more tired than usual the last couple of days but has had not bleeding until this am. Reports has been on coumadin until 10/27/11 when she requested being taken off as she would like to eat vegetables. She was changed to xarelto.  She also reports seeing Dr. Rhea Belton about 4 weeks ago for abdominal pain/constipation and had a scope at that time. She denies dizziness, sob, CP or palpitations. Symptoms came on suddenly have persisted. Nothing makes better or worse. In ed Hg 9.8 and INR 2.99. VSS. We will admit for further evaluation   Review of Systems:  Gen: denies fever, chills anorexia +fatigue CV denies CP, palpitations, LEE Resp; denies SOB cough,  MS denies joint pain/swelling/erythema Neuro; denies headache, visual disturbances Abdom; denies pain/nausea/vomiting +chronic constipation +BRBPR as well as maroon stool with clots Skin: denies rash unusual bruising Heme see HPI   Past Medical History  Diagnosis Date  . CAD (coronary artery disease)     Non-STEMI January, 2012.. bare-metal stents to ramus and circumflex, 80% small second diagonal to be followed  /   . Aortic stenosis, moderate     Moderate.. echo.. January 2 012  . Aortic insufficiency     Mild... echo.. January, 2012  . Mitral regurgitation     EF 60%... echo... January, 2012,  mild MR, severe annular calcification  . HTN (hypertension)   . Diverticulosis   . Pulmonary HTN     67 mmHg.. echo.. January, 2012  . Renal arterial thrombosis     January, 2012 while on aspirin and  Plavix for coronary stents ( atrial fibrillation) Plavix stopped and Coumadin started  . Renal insufficiency     Creatinine 1.4.. April 29, 2010  . Anemia     Hemoglobin 9.7, February, 2012  . Cirrhosis of liver     appearance On CT, January, 2012  . Drug therapy     Plavix used for bare-metal stent stopped after several weeks when Coumadin and started for renal artery thrombus  . Atrial fibrillation     New diagnosis January, 2012, Coumadin not used initially because of Plavix and aspirin and age  for stents, renal artery thrombus, Plavix stopped Coumadin started April 23, 2010  . Drug therapy     Coumadin.Marland Kitchen started April 23, 2010 after renal artery embolus (atrial fibrillation)  . Renal mass     Low density, upper pole left kidney, CT January 2 012, etiology unclear, MRI can be considered  . Renal artery stenosis     Suspected from abdominal CT January, 2012.. significant   Past Surgical History  Procedure Date  . Total abdominal hysterectomy   . Dilation and curettage of uterus   . Stints January 2012    2 stints   Social History:  reports that she has never smoked. She has never used smokeless tobacco. She reports that she does not drink alcohol or use illicit drugs. Pt lives at home with 24/7 caregivers. Gets assistance with ADL Allergies  Allergen Reactions  . Penicillins   . Sulfa Drugs Cross Reactors   . Sulfonamide Derivatives  Family History  Problem Relation Age of Onset  . Hypertension      (be sure to complete)  Prior to Admission medications   Medication Sig Start Date End Date Taking? Authorizing Provider  ALPRAZolam Prudy Feeler) 0.5 MG tablet Take 0.5 mg by mouth 3 (three) times daily as needed.     Yes Historical Provider, MD  aspirin 81 MG tablet Take 81 mg by mouth daily.     Yes Historical Provider, MD  atorvastatin (LIPITOR) 10 MG tablet Take 10 mg by mouth daily. 06/01/11  Yes Luis Abed, MD  diltiazem (CARDIZEM CD) 240 MG 24 hr capsule  05/19/11   Yes Luis Abed, MD  metoprolol (LOPRESSOR) 50 MG tablet Take 50 mg by mouth 2 (two) times daily.   Yes Historical Provider, MD  oxyCODONE-acetaminophen (PERCOCET/ROXICET) 5-325 MG per tablet Take 1 tablet by mouth every 4 (four) hours as needed. pain   Yes Historical Provider, MD  potassium chloride (KLOR-CON) 10 MEQ CR tablet Take 10 mEq by mouth daily.    Yes Historical Provider, MD  Rivaroxaban (XARELTO) 20 MG TABS Take 20 mg by mouth daily.   Yes Historical Provider, MD  zolpidem (AMBIEN) 5 MG tablet Take 5 mg by mouth at bedtime as needed.     Yes Historical Provider, MD  nitroGLYCERIN (NITROSTAT) 0.4 MG SL tablet Place 0.4 mg under the tongue every 5 (five) minutes as needed.      Historical Provider, MD   Physical Exam: Filed Vitals:   10/31/11 0724 10/31/11 0728 10/31/11 0936  BP: 156/76  165/84  Pulse: 72  83  Temp: 98.3 F (36.8 C)  98.5 F (36.9 C)  TempSrc: Oral  Oral  Resp: 18  18  Height:  5\' 3"  (1.6 m)   Weight:  63.504 kg (140 lb)   SpO2: 100%  99%     General:  Alert, somewhat pale appearing NAD  Eyes: PERRL EOMI  ENT: mucus membranes mouth moist/pink, no drainage from nose, ears clear  Neck: supple full rom  Cardiovascular: RRR trace LEE No gallup rub  Respiratory: normal effort BSCTAB no wheeze  Abdomen: round, soft, +BS non-tender to palpation  Skin: warm and dry, no lesions/rash  Musculoskeletal: MOE full rom no joint swelling erythema  Psychiatric: appropriate affect/mood  Neurologic: oriented x3 speech clear cranial nerve II-XII intact  Labs on Admission:  Basic Metabolic Panel:  Lab 10/31/11 0981  NA 138  K 4.5  CL 107  CO2 23  GLUCOSE 103*  BUN 26*  CREATININE 0.99  CALCIUM 8.6  MG --  PHOS --   Liver Function Tests:  Lab 10/31/11 0805  AST 18  ALT 18  ALKPHOS 65  BILITOT 0.5  PROT 5.9*  ALBUMIN 3.2*   No results found for this basename: LIPASE:5,AMYLASE:5 in the last 168 hours No results found for this basename:  AMMONIA:5 in the last 168 hours CBC:  Lab 10/31/11 0805  WBC 11.7*  NEUTROABS 9.4*  HGB 9.8*  HCT 30.7*  MCV 97.5  PLT 212   Cardiac Enzymes: No results found for this basename: CKTOTAL:5,CKMB:5,CKMBINDEX:5,TROPONINI:5 in the last 168 hours  BNP (last 3 results) No results found for this basename: PROBNP:3 in the last 8760 hours CBG: No results found for this basename: GLUCAP:5 in the last 168 hours  Radiological Exams on Admission: No results found.  EKG: Independently reviewed.   Assessment/Plan Principal Problem:  *Hematochezia Active Problems:  A-fib  CAD (coronary artery disease)  HTN (hypertension)  Renal insufficiency  Anemia 1. BRBPR/Hematochezia: likely related to coumadin/xarelto use in setting of hx of diverticulosis. Will admit to tele as VSS and Hg 9.8. Has had 3 small BM's in ED with BRB. Will make NPO, support with IV fluids, IV protonix. Will request GI consult. Serial CBC's. Will hold asa and Xarelto, type and screen, Will transfuse 2 units of FFP today, Monitor closely  2. Anemia: related to #1 and has chronic anemia as well. Will get serial CBC and monitor closely. Will consider transfusion if Hg <9  3. HTN : currently stable with SBP range 126-165. Will hold BB for now due to #1. Monitor closely  4. Afib: rate controlled on coumadin until 10/27/11 when changed to xarelto. INR 2.99. Will hold xarelto, continue cardizem with parameters. Monitor closely  5. CAD/Aortic stenosis/mitral regurg: echo 1/12 yields EF 60-655, moderate aortic stenosis, mild mitral regurg with severely increased systolic pressure. Stable at baseline. No CP. Will hold BB for now secondary to #1. Monitor I & O's, daily wts.   6. Diverticulosis: see problem #1. Pt reports seeing GI 4 weeks ago for abdominal pain/consitpation. See treatment for problem #1.    Family Communication: pt and caregiver at bedside Disposition Plan: has 4 caregivers 24x7, Back home with caregivers when  medically stable.   Time spent: 40 minutes.   Gwenyth Bender NP [facility] Pager [phone]  If 7PM-7AM, please contact night-coverage [email or web address] Password [facility] 10/31/2011, 10:36 AM   Pt seen and examined 89/F with afib, CAD, valvular heart disease was started on Xarelto on Friday, she had been on coumadin and this was stopped on Friday, sudden onset on hematochezia x4 today with clots, Suspect diverticular vs AVMs In ER, Hb is 9.8, down from 10.7 yesterday and PT of 31.5 and INR of 2.99 Hold ASA/xarelto Will transfuse 2units FFP Type and screen CBC Q12, Transfuse PRN [facility] consult  Code status:discussed with patient, wishes to be DNR  Zannie Cove, MD [facility] [phone]

## 2011-10-31 NOTE — ED Notes (Signed)
Per patient, went to bathroom this morning and saw blood in her stools-states dark red clots then bright red blood

## 2011-10-31 NOTE — ED Provider Notes (Signed)
History     CSN: [id number]  Arrival date & time 10/31/11  0713   First MD Initiated Contact with Patient 10/31/11 [phone]      Chief Complaint  Patient presents with  . Rectal Bleeding    (Consider location/radiation/quality/duration/timing/severity/associated sxs/prior treatment) Patient is a 78 y.o. female presenting with hematochezia. The history is provided by the patient.  Rectal Bleeding    patient here for a blood per rectum which began today. Denies abdominal pain, fever, vomiting. She was on Coumadin but was switched to xarelto patient had a colonoscopy done 3 weeks ago for abdominal pain. Denies any history of GI bleeding. Notes some dizziness without near syncope. No medications used prior to arrival and blood per rectum has been described as dark and with clots  Past Medical History  Diagnosis Date  . CAD (coronary artery disease)     Non-STEMI January, 2012.. bare-metal stents to ramus and circumflex, 80% small second diagonal to be followed  /   . Aortic stenosis, moderate     Moderate.. echo.. January 2 012  . Aortic insufficiency     Mild... echo.. January, 2012  . Mitral regurgitation     EF 60%... echo... January, 2012,  mild MR, severe annular calcification  . HTN (hypertension)   . Diverticulosis   . Pulmonary HTN     67 mmHg.. echo.. January, 2012  . Renal arterial thrombosis     January, 2012 while on aspirin and Plavix for coronary stents ( atrial fibrillation) Plavix stopped and Coumadin started  . Renal insufficiency     Creatinine 1.4.. April 29, 2010  . Anemia     Hemoglobin 9.7, February, 2012  . Cirrhosis of liver     appearance On CT, January, 2012  . Drug therapy     Plavix used for bare-metal stent stopped after several weeks when Coumadin and started for renal artery thrombus  . Atrial fibrillation     New diagnosis January, 2012, Coumadin not used initially because of Plavix and aspirin and age  for stents, renal artery thrombus, Plavix  stopped Coumadin started April 23, 2010  . Drug therapy     Coumadin.Marland Kitchen started April 23, 2010 after renal artery embolus (atrial fibrillation)  . Renal mass     Low density, upper pole left kidney, CT January 2 012, etiology unclear, MRI can be considered  . Renal artery stenosis     Suspected from abdominal CT January, 2012.. significant    Past Surgical History  Procedure Date  . Total abdominal hysterectomy   . Dilation and curettage of uterus   . Stints January 2012    2 stints    Family History  Problem Relation Age of Onset  . Hypertension      History  Substance Use Topics  . Smoking status: Never Smoker   . Smokeless tobacco: Not on file  . Alcohol Use: No    OB History    Grav Para Term Preterm Abortions TAB SAB Ect Mult Living                  Review of Systems  Gastrointestinal: Positive for hematochezia.  All other systems reviewed and are negative.    Allergies  Penicillins; Sulfa drugs cross reactors; and Sulfonamide derivatives  Home Medications   Current Outpatient Rx  Name Route Sig Dispense Refill  . ALPRAZOLAM 0.5 MG PO TABS Oral Take 0.5 mg by mouth 3 (three) times daily as needed.      Marland Kitchen  ASPIRIN 81 MG PO TABS Oral Take 81 mg by mouth daily.      . ATORVASTATIN CALCIUM 10 MG PO TABS Oral Take 1 tablet (10 mg total) by mouth daily. 30 tablet 6  . DILTIAZEM HCL ER COATED BEADS 240 MG PO CP24  TAKE ONE CAPSULE BY MOUTH EVERY DAY 30 capsule 5  . METOPROLOL TARTRATE 50 MG PO TABS  TAKE 1 TABLET BY MOUTH TWICE A DAY 60 tablet 6  . NITROGLYCERIN 0.4 MG SL SUBL Sublingual Place 0.4 mg under the tongue every 5 (five) minutes as needed.      Marland Kitchen ONDANSETRON HCL 4 MG PO TABS Oral Take 4 mg by mouth every 8 (eight) hours as needed.      . OXYCODONE HCL PO Oral Take by mouth as needed.    Marland Kitchen POTASSIUM CHLORIDE 10 MEQ PO TBCR Oral Take 10 mEq by mouth 2 (two) times daily.      . WARFARIN SODIUM 5 MG PO TABS  Take as directed by anticoagulation clinic 30  tablet 3    30 day supply  . ZOLPIDEM TARTRATE 5 MG PO TABS Oral Take 5 mg by mouth at bedtime as needed.        BP 156/76  Pulse 72  Temp 98.3 F (36.8 C) (Oral)  Resp 18  Ht 5\' 3"  (1.6 m)  Wt 140 lb (63.504 kg)  BMI 24.80 kg/m2  SpO2 100%  Physical Exam  Nursing note and vitals reviewed. Constitutional: She is oriented to person, place, and time. She appears well-developed and well-nourished.  Non-toxic appearance. No distress.  HENT:  Head: Normocephalic and atraumatic.  Eyes: Conjunctivae, EOM and lids are normal. Pupils are equal, round, and reactive to light.  Neck: Normal range of motion. Neck supple. No tracheal deviation present. No mass present.  Cardiovascular: Normal rate, regular rhythm and normal heart sounds.  Exam reveals no gallop.   No murmur heard. Pulmonary/Chest: Effort normal and breath sounds normal. No stridor. No respiratory distress. She has no decreased breath sounds. She has no wheezes. She has no rhonchi. She has no rales.  Abdominal: Soft. Normal appearance and bowel sounds are normal. She exhibits no distension. There is no tenderness. There is no rebound and no CVA tenderness.       Gross blood per rectum  Musculoskeletal: Normal range of motion. She exhibits no edema and no tenderness.  Neurological: She is alert and oriented to person, place, and time. She has normal strength. No cranial nerve deficit or sensory deficit. GCS eye subscore is 4. GCS verbal subscore is 5. GCS motor subscore is 6.  Skin: Skin is warm and dry. No abrasion and no rash noted.  Psychiatric: She has a normal mood and affect. Her speech is normal and behavior is normal.    ED Course  Procedures (including critical care time)   Labs Reviewed  CBC WITH DIFFERENTIAL  COMPREHENSIVE METABOLIC PANEL  PROTIME-INR  APTT  TYPE AND SCREEN   No results found.   No diagnosis found.    MDM  Patient to be admitted for evaluation of her gastrointestinal  bleeding        Toy Baker, MD 10/31/11 [phone]

## 2011-10-31 NOTE — Progress Notes (Signed)
Pt had a near syncopal event after a BM with large volume bright red blood Subsequently placed back in bed, tachycardic, BP stable, alert talkative and appropriate Ordered 500cc NS bolus, and 2 units PRBC Will transfer to SDU for closer monitoring Yet to receive FFP I also d/w Dr.Shadad, agrees with FFP and stopping the anticoagulants Updated her nephew Marijean Heath  Zannie Cove, MD [phone]

## 2011-10-31 NOTE — Consult Note (Signed)
Referring Provider: triad hospitalist Primary Care Physician:  Abigail Miyamoto, MD Primary Gastroenterologist:  Dr.Pyrtle  Reason for Consultation:  Gi bleed  HPI: [name] is a 78 y.o. female with multiple medical problems, and history of coronary artery disease aortic stenosis mitral regurg hypertension renal artery thrombosis and atrial fibrillation. She is status post a non-ST MI in January of 2012 and had bare-metal stents placed to the ramus and circumflex at that time. She has been maintained on Coumadin, and apparently just had followup with her cardiologist last week and asked to be taken off of the Coumadin. Coumadin was being stopped and she was started on Xarelto. Her last dose of Coumadin was on Friday /82, her first dose of Xarelto was given on Saturday 8/3 She presented to the emergency room this morning with onset of bloody stools and weakness . Patient says she woke up at about 5:00 this morning with urge for bowel movement and to urinate. She passed a bowel movement and then realized that there was a commode full of blood. She called for her age who stays with her at night. She had another large grossly bloody bowel movement and he came onto the emergency room. In the emergency room she had 3-4 more grossly bloody bowel movements but apparently less volume in which she had been passing at home. She denies any shortness of breath chest pain or syncope. She has not had any abdominal pain or cramping and had felt in her usual state of health up until early this morning.  She does have history of diverticular disease, however I do not find any records of prior colonoscopies in her office records or in [place]. She did see Dr. Rhea Belton in the office recently with complaints of constipation, that was an initial visit. She did have CT scan of the abdomen and pelvis done which was negative for any acute findings she was noted to have sigmoid diverticulosis and constipation  On admission  she was hemodynamically stable, hemoglobin was not 9.8 hematocrit of 30.7. CBC was done a couple of weeks ago when her hemoglobin was 10.7 hematocrit of 32.2. Iron studies were also done at that same time and were normal. INR today is 2.9. Patient states that she has never had a colonoscopy, and has no prior history of GI bleeding She had an EGD done by Dr. Olivia Mackie in [place]  within the past couple of years.      Past Medical History  Diagnosis Date  . CAD (coronary artery disease)     Non-STEMI January, 2012.. bare-metal stents to ramus and circumflex, 80% small second diagonal to be followed  /   . Aortic stenosis, moderate     Moderate.. echo.. January 2 012  . Aortic insufficiency     Mild... echo.. January, 2012  . Mitral regurgitation     EF 60%... echo... January, 2012,  mild MR, severe annular calcification  . HTN (hypertension)   . Diverticulosis   . Pulmonary HTN     67 mmHg.. echo.. January, 2012  . Renal arterial thrombosis     January, 2012 while on aspirin and Plavix for coronary stents ( atrial fibrillation) Plavix stopped and Coumadin started  . Renal insufficiency     Creatinine 1.4.. April 29, 2010  . Anemia     Hemoglobin 9.7, February, 2012  . Cirrhosis of liver     appearance On CT, January, 2012  . Drug therapy     Plavix used for bare-metal stent  stopped after several weeks when Coumadin and started for renal artery thrombus  . Atrial fibrillation     New diagnosis January, 2012, Coumadin not used initially because of Plavix and aspirin and age  for stents, renal artery thrombus, Plavix stopped Coumadin started April 23, 2010  . Drug therapy     Coumadin.Marland Kitchen started April 23, 2010 after renal artery embolus (atrial fibrillation)  . Renal mass     Low density, upper pole left kidney, CT January 2 012, etiology unclear, MRI can be considered  . Renal artery stenosis     Suspected from abdominal CT January, 2012.. significant    Past Surgical  History  Procedure Date  . Total abdominal hysterectomy   . Dilation and curettage of uterus   . Stints January 2012    2 stints    Prior to Admission medications   Medication Sig Start Date End Date Taking? Authorizing Provider  ALPRAZolam Prudy Feeler) 0.5 MG tablet Take 0.5 mg by mouth 3 (three) times daily as needed.     Yes Historical Provider, MD  aspirin 81 MG tablet Take 81 mg by mouth daily.     Yes Historical Provider, MD  atorvastatin (LIPITOR) 10 MG tablet Take 10 mg by mouth daily. 06/01/11  Yes Luis Abed, MD  diltiazem (CARDIZEM CD) 240 MG 24 hr capsule  05/19/11  Yes Luis Abed, MD  metoprolol (LOPRESSOR) 50 MG tablet Take 50 mg by mouth 2 (two) times daily.   Yes Historical Provider, MD  oxyCODONE-acetaminophen (PERCOCET/ROXICET) 5-325 MG per tablet Take 1 tablet by mouth every 4 (four) hours as needed. pain   Yes Historical Provider, MD  potassium chloride (KLOR-CON) 10 MEQ CR tablet Take 10 mEq by mouth daily.    Yes Historical Provider, MD  Rivaroxaban (XARELTO) 20 MG TABS Take 20 mg by mouth daily.   Yes Historical Provider, MD  zolpidem (AMBIEN) 5 MG tablet Take 5 mg by mouth at bedtime as needed.     Yes Historical Provider, MD  nitroGLYCERIN (NITROSTAT) 0.4 MG SL tablet Place 0.4 mg under the tongue every 5 (five) minutes as needed.      Historical Provider, MD    Current Facility-Administered Medications  Medication Dose Route Frequency Provider Last Rate Last Dose  . 0.9 %  sodium chloride infusion   Intravenous Once Toy Baker, MD      . HYDROmorphone (DILAUDID) injection 0.5 mg  0.5 mg Intravenous Once Gwenyth Bender, NP   0.5 mg at 10/31/11 1257   Current Outpatient Prescriptions  Medication Sig Dispense Refill  . ALPRAZolam (XANAX) 0.5 MG tablet Take 0.5 mg by mouth 3 (three) times daily as needed.        Marland Kitchen aspirin 81 MG tablet Take 81 mg by mouth daily.        Marland Kitchen atorvastatin (LIPITOR) 10 MG tablet Take 10 mg by mouth daily.      Marland Kitchen diltiazem (CARDIZEM  CD) 240 MG 24 hr capsule       . metoprolol (LOPRESSOR) 50 MG tablet Take 50 mg by mouth 2 (two) times daily.      Marland Kitchen oxyCODONE-acetaminophen (PERCOCET/ROXICET) 5-325 MG per tablet Take 1 tablet by mouth every 4 (four) hours as needed. pain      . potassium chloride (KLOR-CON) 10 MEQ CR tablet Take 10 mEq by mouth daily.       . Rivaroxaban (XARELTO) 20 MG TABS Take 20 mg by mouth daily.      Marland Kitchen  zolpidem (AMBIEN) 5 MG tablet Take 5 mg by mouth at bedtime as needed.        Marland Kitchen DISCONTD: atorvastatin (LIPITOR) 10 MG tablet Take 1 tablet (10 mg total) by mouth daily.  30 tablet  6  . DISCONTD: diltiazem (CARDIZEM CD) 240 MG 24 hr capsule TAKE ONE CAPSULE BY MOUTH EVERY DAY  30 capsule  5  . DISCONTD: metoprolol (LOPRESSOR) 50 MG tablet TAKE 1 TABLET BY MOUTH TWICE A DAY  60 tablet  6  . nitroGLYCERIN (NITROSTAT) 0.4 MG SL tablet Place 0.4 mg under the tongue every 5 (five) minutes as needed.          Allergies as of 10/31/2011 - Review Complete 10/31/2011  Allergen Reaction Noted  . Penicillins    . Sulfa drugs cross reactors  08/08/2010  . Sulfonamide derivatives      Family History  Problem Relation Age of Onset  . Hypertension      History   Social History  . Marital Status: Single    Spouse Name: N/A    Number of Children: N/A  . Years of Education: N/A   Occupational History  . retired    Social History Main Topics  . Smoking status: Never Smoker   . Smokeless tobacco: Never Used  . Alcohol Use: No  . Drug Use: No  . Sexually Active: Not on file   Other Topics Concern  . Not on file   Social History Narrative  . No narrative on file    Review of Systems: Pertinent positive and negative review of systems were noted in the above HPI section.  All other review of systems was otherwise negative. Physical Exam: Vital signs in last 24 hours: Temp:  [98.3 F (36.8 C)-99.1 F (37.3 C)] 99.1 F (37.3 C) (08/06 1125) Pulse Rate:  [72-91] 91  (08/06 1125) Resp:  [18-19]  19  (08/06 1125) BP: (155-165)/(76-84) 155/76 mmHg (08/06 1125) SpO2:  [99 %-100 %] 99 % (08/06 1125) Weight:  [140 lb (63.504 kg)] 140 lb (63.504 kg) (08/06 0728)   General:   Alert,  Well-developed, well-nourished, pleasant and cooperative in NAD, in good spirits Head:  Normocephalic and atraumatic. Eyes:  Sclera clear, no icterus.   Conjunctiva pale. Ears:  Normal auditory acuity. Nose:  No deformity, discharge,  or lesions. Mouth:  No deformity or lesions.   Neck:  Supple; no masses or thyromegaly. Lungs:  Clear throughout to auscultation.   No wheezes, crackles, or rhonchi . Heart:  Regular rate and rhythm; squeaky systolic murmur Abdomen:  Soft,nontender, BS active,nonpalp mass or hsm.   Rectal:  Not done- maroon stool witnessed in ER  Msk:  Symmetrical without gross deformities. . Pulses:  Normal pulses noted. Extremities:  Without clubbing or edema. Neurologic:  Alert and  oriented x4;  grossly normal neurologically. Skin:  Intact without significant lesions or rashes.. Psych:  Alert and cooperative. Normal mood and affect.  Intake/Output from previous day:   Intake/Output this shift: Total I/O In: 1000 [I.V.:1000] Out: -   Lab Results:  Basename 10/31/11 0805  WBC 11.7*  HGB 9.8*  HCT 30.7*  PLT 212   BMET  Basename 10/31/11 0805  NA 138  K 4.5  CL 107  CO2 23  GLUCOSE 103*  BUN 26*  CREATININE 0.99  CALCIUM 8.6   LFT  Basename 10/31/11 0805  PROT 5.9*  ALBUMIN 3.2*  AST 18  ALT 18  ALKPHOS 65  BILITOT 0.5  BILIDIR --  IBILI --   PT/INR  Basename 10/31/11 0805  LABPROT 31.5*  INR 2.99*      IMPRESSION:  #29 78 year old female with acute probable lower GI bleed in the setting of anticoagulation with Coumadin, Xarelto and aspirin. Suspect this is diverticular, she has documented diverticulosis on recent CT scan. Cannot rule out AVMs or other mucosal lesions. #2 supratherapeutic INR, patient has been off Coumadin for 4 days suspect her INR  was significantly elevated, previously, and addition of simultaneous Xarelto triggered her bleeding #3 history of chronic anemia normocytic #4 aortic stenosis #5 pulmonary hypertension #6 history of renal artery thrombosis January 2012 #7 ordinary artery disease status post MI 2012 with bare-metal stents to the ramus and circumflex #8 atrial fibrillation  PLAN: #1 clear liquid diet #2 supportive management with IV fluids, and transfusions as indicated to keep her hemoglobin 9-10 #3 FFP if she has further active bleeding today #4 vitamin K #5 hold Xarelto #6 Will obtain her records from gastroenterologist in [place] #7 will consider endoscopic evaluation once her coagulopathy is corrected, however given her age and multiple comorbidities could argue for conservative management if her bleeding resolves quickly, as this is very likely diverticular.   Amy Esterwood  10/31/2011, 1:21 PM  I have reviewed the above note, examined the patient and agree with plan of treatment. Rapid painless LGIB likely diverticular, likely precipitated by supratherapeutic anticoagulation. Question whether she ought to return on any anticoagulants after this event. We will not plan colonoscopy  at this time due to her age and the risks the prep and sedation may pose. She will be observed till coags return to normal.She has an appointment with Dr Myrtis Ser this week, he will need to have an input about future anticoagulation.

## 2011-10-31 NOTE — ED Notes (Signed)
Report to Irving Burton, RN-transfer to 1430

## 2011-10-31 NOTE — ED Notes (Signed)
Patient denies dizziness, shortness of breath-steady gait, although tires easily with increased activity-care taker at bedside will continue to monitor

## 2011-10-31 NOTE — ED Notes (Signed)
Up to bedside commode, still having small amount of bright red blood with stool-asymptomatic, denies dizziness, SOB-vitals within normal limits

## 2011-10-31 NOTE — Progress Notes (Signed)
[name], is a 78 y.o. female,   MRN: [id number]  -  DOB - [birth year]  Outpatient Primary MD for the patient is Abigail Miyamoto, MD  in for    Chief Complaint  Patient presents with  . Rectal Bleeding     Blood pressure 156/76, pulse 72, temperature 98.3 F (36.8 C), temperature source Oral, resp. rate 18, height 5\' 3"  (1.6 m), weight 63.504 kg (140 lb), SpO2 100.00%.  Principal Problem:  *Hematochezia Active Problems:  A-fib  CAD (coronary artery disease)  HTN (hypertension)  Renal insufficiency  Anemia   78 yo hx afib diagnosed 1/12 and on coumadin until 10/27/11 presents to ED cc BRBPR as well as dark maroon stool with clots. Denies abdominal pain/nausea/vomiting. Reports some fatigue over last couple of days. States that she requested to be taken off of coumadin as she wanted to eat her green veggies. She was switched to xarelto. No bleeding noted prior to this am. Of note, pt had colonoscopy 4 weeks ago Dr. Rhea Belton for abdominal pain, and it was reportedly normal.  In ED Hg 9.8 which seems to be lower end of her range according to chart. SBP range 126-156, HR 80-100. Somewhat pale appearing. Had 1 BM in ED. INR 2.99  Will admit to tele. Have paged GI. I asked pt about DNR and she said she does not want ventilator but would like CPR/shock.

## 2011-10-31 NOTE — ED Notes (Signed)
Patient up to bedside commode-bright red blood when she wiped-strong odor

## 2011-11-01 ENCOUNTER — Inpatient Hospital Stay (HOSPITAL_COMMUNITY): Payer: Medicare Other

## 2011-11-01 ENCOUNTER — Encounter (HOSPITAL_COMMUNITY): Payer: Self-pay | Admitting: Physician Assistant

## 2011-11-01 DIAGNOSIS — N189 Chronic kidney disease, unspecified: Secondary | ICD-10-CM

## 2011-11-01 DIAGNOSIS — K922 Gastrointestinal hemorrhage, unspecified: Secondary | ICD-10-CM

## 2011-11-01 DIAGNOSIS — I4891 Unspecified atrial fibrillation: Secondary | ICD-10-CM

## 2011-11-01 DIAGNOSIS — K5730 Diverticulosis of large intestine without perforation or abscess without bleeding: Secondary | ICD-10-CM

## 2011-11-01 DIAGNOSIS — I359 Nonrheumatic aortic valve disorder, unspecified: Secondary | ICD-10-CM

## 2011-11-01 DIAGNOSIS — D649 Anemia, unspecified: Secondary | ICD-10-CM

## 2011-11-01 LAB — BASIC METABOLIC PANEL
BUN: 19 mg/dL (ref 6–23)
CO2: 24 mEq/L (ref 19–32)
Calcium: 8.4 mg/dL (ref 8.4–10.5)
Glucose, Bld: 98 mg/dL (ref 70–99)
Potassium: 4.3 mEq/L (ref 3.5–5.1)
Sodium: 138 mEq/L (ref 135–145)

## 2011-11-01 LAB — CBC
HCT: 23.6 % — ABNORMAL LOW (ref 36.0–46.0)
HCT: 29.1 % — ABNORMAL LOW (ref 36.0–46.0)
Hemoglobin: 8.3 g/dL — ABNORMAL LOW (ref 12.0–15.0)
Hemoglobin: 10.5 g/dL — ABNORMAL LOW (ref 12.0–15.0)
Hemoglobin: 10.2 g/dL — ABNORMAL LOW (ref 12.0–15.0)
MCH: 31.8 pg (ref 26.0–34.0)
MCH: 30.7 pg (ref 26.0–34.0)
MCHC: 35.2 g/dL (ref 30.0–36.0)
Platelets: 115 10*3/uL — ABNORMAL LOW (ref 150–400)
RBC: 2.61 MIL/uL — ABNORMAL LOW (ref 3.87–5.11)
RBC: 3.42 MIL/uL — ABNORMAL LOW (ref 3.87–5.11)
RBC: 3.27 MIL/uL — ABNORMAL LOW (ref 3.87–5.11)
RDW: 17.4 % — ABNORMAL HIGH (ref 11.5–15.5)
WBC: 8.9 10*3/uL (ref 4.0–10.5)
WBC: 8.6 10*3/uL (ref 4.0–10.5)

## 2011-11-01 LAB — PROTIME-INR
INR: 1.61 — ABNORMAL HIGH (ref 0.00–1.49)
Prothrombin Time: 19.4 seconds — ABNORMAL HIGH (ref 11.6–15.2)

## 2011-11-01 MED ORDER — DILTIAZEM HCL 60 MG PO TABS
60.0000 mg | ORAL_TABLET | ORAL | Status: DC
  Administered 2011-11-01 – 2011-11-03 (×9): 60 mg via ORAL
  Filled 2011-11-01 (×16): qty 1

## 2011-11-01 MED ORDER — OXYCODONE-ACETAMINOPHEN 5-325 MG PO TABS
1.0000 | ORAL_TABLET | Freq: Three times a day (TID) | ORAL | Status: DC | PRN
  Administered 2011-11-01: 1 via ORAL
  Filled 2011-11-01: qty 1

## 2011-11-01 MED ORDER — DILTIAZEM HCL ER COATED BEADS 300 MG PO CP24
300.0000 mg | ORAL_CAPSULE | Freq: Every day | ORAL | Status: DC
  Administered 2011-11-01: 300 mg via ORAL
  Filled 2011-11-01: qty 1

## 2011-11-01 MED ORDER — DILTIAZEM HCL 100 MG IV SOLR
5.0000 mg/h | INTRAVENOUS | Status: DC
  Administered 2011-11-01: 5 mg/h via INTRAVENOUS
  Filled 2011-11-01: qty 100

## 2011-11-01 MED ORDER — SODIUM PERTECHNETATE TC 99M INJECTION
25.0000 | Freq: Once | INTRAVENOUS | Status: AC | PRN
Start: 2011-11-01 — End: 2011-11-01
  Administered 2011-11-01: 25 via INTRAVENOUS

## 2011-11-01 NOTE — Consult Note (Signed)
Name: [name] MRN: [id number] DOB: [birth year]    LOS: 1  History of Present Illness:  78 yo with a medical hx that includes CAD, Afib, Mitral regurg, aortic stenosis, renal arterial thrombosis, diverticulosis HTN who presents to ED on 8/6 w/ cc BRBPR. Reports has been on coumadin until 10/27/11 when she requested being taken off as she would like to eat vegetables. She was changed to xarelto. She also reports seeing Dr. Rhea Belton about 4 weeks ago for abdominal pain/constipation and had a scope at that time. She denies dizziness, sob, CP or palpitations. Symptoms came on suddenly and  persisted.  In ed Hg 9.8 and INR 2.99. She was admitted to reg med bed. During the PM hours on 8/6 had near syncopal event after large maroon colored BM. Received 3 units of RBCs and also 3 FFP from 8/6 to 8/7. Source felt to be diverticular bleed in setting of coagulopathy on xarelto. PCCM was asked to evaluate given concern for high risk for decompensation. Also concern that she may need central access.   Lines / Drains:   Cultures: n/a  Antibiotics: N/a  Tests / Events: Bleeding scan 8/7>>> No scintigraphic evidence for active gastrointestinal hemorrhage.    Past Medical History  Diagnosis Date  . CAD (coronary artery disease)     Non-STEMI January, 2012.. bare-metal stents to ramus and circumflex, 80% small second diagonal to be followed  /   . Aortic stenosis, moderate     Moderate.. echo.. January 2 012  . Aortic insufficiency     Mild... echo.. January, 2012  . Mitral regurgitation     EF 60%... echo... January, 2012,  mild MR, severe annular calcification  . HTN (hypertension)   . Diverticulosis   . Pulmonary HTN     67 mmHg.. echo.. January, 2012  . Renal arterial thrombosis     January, 2012 while on aspirin and Plavix for coronary stents ( atrial fibrillation) Plavix stopped and Coumadin started  . Renal insufficiency     Creatinine 1.4.. April 29, 2010  . Anemia    Hemoglobin 9.7, February, 2012  . Cirrhosis of liver     appearance On CT, January, 2012  . Drug therapy     Plavix used for bare-metal stent stopped after several weeks when Coumadin and started for renal artery thrombus  . Atrial fibrillation     New diagnosis January, 2012, Coumadin not used initially because of Plavix and aspirin and age  for stents, renal artery thrombus, Plavix stopped Coumadin started April 23, 2010  . Drug therapy     Coumadin.Marland Kitchen started April 23, 2010 after renal artery embolus (atrial fibrillation)  . Renal mass     Low density, upper pole left kidney, CT January 2 012, etiology unclear, MRI can be considered  . Renal artery stenosis     Suspected from abdominal CT January, 2012.. significant   Past Surgical History  Procedure Date  . Total abdominal hysterectomy   . Dilation and curettage of uterus   . Stints January 2012    2 stints   Prior to Admission medications   Medication Sig Start Date End Date Taking? Authorizing Provider  ALPRAZolam Prudy Feeler) 0.5 MG tablet Take 0.5 mg by mouth 3 (three) times daily as needed.     Yes Historical Provider, MD  aspirin 81 MG tablet Take 81 mg by mouth daily.     Yes Historical Provider, MD  atorvastatin (LIPITOR) 10 MG tablet Take  10 mg by mouth daily. 06/01/11  Yes Luis Abed, MD  diltiazem (CARDIZEM CD) 240 MG 24 hr capsule  05/19/11  Yes Luis Abed, MD  metoprolol (LOPRESSOR) 50 MG tablet Take 50 mg by mouth 2 (two) times daily.   Yes Historical Provider, MD  oxyCODONE-acetaminophen (PERCOCET/ROXICET) 5-325 MG per tablet Take 1 tablet by mouth every 4 (four) hours as needed. pain   Yes Historical Provider, MD  potassium chloride (KLOR-CON) 10 MEQ CR tablet Take 10 mEq by mouth daily.    Yes Historical Provider, MD  Rivaroxaban (XARELTO) 20 MG TABS Take 20 mg by mouth daily.   Yes Historical Provider, MD  zolpidem (AMBIEN) 5 MG tablet Take 5 mg by mouth at bedtime as needed.     Yes Historical Provider, MD    nitroGLYCERIN (NITROSTAT) 0.4 MG SL tablet Place 0.4 mg under the tongue every 5 (five) minutes as needed.      Historical Provider, MD   Allergies Allergies  Allergen Reactions  . Penicillins   . Sulfa Drugs Cross Reactors   . Sulfonamide Derivatives     Family History Family History  Problem Relation Age of Onset  . Hypertension      Social History  reports that she has never smoked. She has never used smokeless tobacco. She reports that she does not drink alcohol or use illicit drugs.  Review Of Systems  Review of Systems  Constitutional: No weight loss, gain, night sweats, Fevers, chills, fatigue .  HEENT: No headaches, visual changes, Difficulty swallowing, Tooth/dental problems, or Sore throat,  No sneezing, itching, ear ache, nasal congestion, post nasal drip, no visual complaints CV: No chest pain, Orthopnea, PND, swelling in lower extremities, dizziness, palpitations, syncope.  GI No heartburn, indigestion, abdominal pain, nausea, vomiting, diarrhea, change in bowel habits, loss of appetite, bloody stools.  Resp: No cough, No coughing up of blood. No change in color of mucus. No wheezing.  Skin: no rash or itching or icterus GU: no dysuria, change in color of urine, no urgency or frequency. No flank pain, no hematuria  MS: No joint pain or swelling. No decreased range of motion  Psych: No change in mood or affect. No depression or anxiety.  Neuro: no difficulty with speech, weakness, numbness, ataxia    Vital Signs: BP 165/91  Pulse 148  Temp 97.6 F (36.4 C) (Oral)  Resp 28  Ht 5\' 3"  (1.6 m)  Wt 70 kg (154 lb 5.2 oz)  BMI 27.34 kg/m2  SpO2 97% Room air       . sodium chloride Stopped (11/01/11 0933)  . sodium chloride 500 mL/hr at 10/31/11 1739  . diltiazem (CARDIZEM) infusion       Intake/Output Summary (Last 24 hours) at 11/01/11 1356 Last data filed at 11/01/11 1100  Gross per 24 hour  Intake   3407 ml  Output    475 ml  Net   2932 ml     Physical Examination: General:  No acute distress.  Neuro:  Awake, alert, no distress.  HEENT:  [place], no JVD Cardiovascular:  Irregular irregular AF w/ RVR on tele Lungs:  Clear to auscultation  Abdomen:  Non-tender Musculoskeletal:  intact Skin:  intact  Ventilator settings:    Labs and Imaging:   Lab 11/01/11 0509 10/31/11 0805  NA 138 138  K 4.3 4.5  CL 109 107  CO2 24 23  BUN 19 26*  CREATININE 0.87 0.99  GLUCOSE 98 103*    Lab  11/01/11 0509 10/31/11 0805  HGB 8.3* 9.8*  HCT 23.6* 30.7*  WBC 8.4 11.7*  PLT 108* 212   Lab Results  Component Value Date   INR 1.61* 11/01/2011   INR 2.99* 10/31/2011   INR 2.0 06/06/2011    Assessment and Plan:  GIB (gastrointestinal bleeding), w/ Acute Blood loss anemia: felt to be diverticular bleed in setting of drug induced coagulopathy (xarelto).  Has received several units of blood as well as FFP. INR almost normalized. Her NM GI bleeding scan was reassuring. Have not seen a follow up Hgb since earlier this am, but suspect it has leveled off.  Plan: -f/u INR--> would correct to INR goal of < 1.5 -f/u CBC, hoping that Hgb has leveled out since earlier in am -will hold off from central access as she has a reassuring bleeding scan and her BP is stable.  -unfortunately think this means no more anticoagulation in the future.    A-fib w/ RVR, w/ h/o CAD (coronary artery disease) and AS: Currently in AF w/ RVR, this could reflect the current physiologic demands of active bleeding. She is not a candidate for further anti-coagulation.  Plan: -rate control, agree w/ CCB as long as she can tolerate it hemodynamically.  -further recs per cards.    Renal insufficiency Recent Labs  [facility] 11/01/11 0509 10/31/11 0805   CREATININE 0.87 0.99  Plan: Monitor Avoid hypotension  Best practices / Disposition: -->SDU: triad pccm and GI are consults.  -->full code -->PAS for DVT Px -->Protonix for GI Px -->diet clears -->family  updated at bedside    BABCOCK,PETE 11/01/2011, 1:56 PM   Attending Addendum:  I have seen the patient, discussed the issues, test results and plans with Kreg Shropshire, NP. I agree with the Assessment and Plans as ammended above.   Levy Pupa, MD, PhD 11/01/2011, 2:34 PM [facility] [phone] or if no answer [phone]

## 2011-11-01 NOTE — Consult Note (Addendum)
Cardiology Consult Note   Patient ID: [name] MRN: [id number], DOB/AGE: 09-25-1918   Admit date: 10/31/2011 Date of Consult: 11/01/2011  Primary Physician: Abigail Miyamoto, MD Primary Cardiologist: Jerral Bonito, MD  Reason for consult: evaluation/management of atrial fibrillation- rate-control and anticoagulation  HPI:   [name] is a 78 yo female with PMHx significant for permanent atrial fibrillation, CAD (NSTEMI 03/2010 s/p BMS to ramus and LCx; 80% small D2 lesion to be followed), moderate AS, mild AI/MR, pulmonary HTN, renal artery thrombosis, CKD (stage 4, Cr <1.00) and hypertension who was admitted to [facility] on 10/31/11 with BRBPR believed to be secondary to diverticulosis.   [name] presented to [facility] ED the morning of admission complaining of BRBPR beginning that same morning. [name] had been taken off Coumadin and started on Xarelto recently per patient preference for dietary reasons. [name] had been seen by Dr. Myrtis Ser in 04/2011, and noted to be stable from an arrhythmic and ischemic cardiac standpoint. [name] had seen her PCP in 10/19/2011 regarding constipation which was treated conservatively with stool softeners. There was no mention of bloody stools. Abdominal CT was ordered and revealed no acute abdominal or pelvic findings, nonobstructive left renal calculus, bilateral renal cortical thinning, mitral valvular calcifications, sigmoid diverticulosis, moderate volume stool burden throughout the colon, unilateral pars defect at L4 with grade 1 anterolisthesis.   [name] presented to the ED with the aforementioned complaint, which persisted after arrival. Hgb was found to be 9.8 (10.7 on PCP follow-up) and INR 2.99. [name] was admitted by medicine and underwent pRBC (x 4) and FFP (x3) infusions. Anticoagulants were stopped. [name] remained hemodynamically stable, but continues to pass bloody stool today. [name] had a presyncopal episode while having a bowel movement last evening. This was  believed to be multifactorial secondary to acute blood loss anemia and vasovagal mediation. INR this AM returned at 1.61. Atrial fibrillation with RVR was noted, and oral diltiazem was increased from 240mg  to 300mg , with low threshold to start diltiazem gtt. [name] continues to bleed (H/H 8.3/23.6, PLT 108 this AM) requiring further transfusions.   [name] is undergoing RBC bleeding scan in nuclear medicine today. On reviewing telemetry, patient has remained in atrial fibrillation with RVR. Rates on admission varied between 110-120 bpm. Around 6:30 AM this morning, her rate sharply increased to 140-150 bpm and sustained prior to her procedure.   Problem List: Past Medical History  Diagnosis Date  . CAD (coronary artery disease)     Non-STEMI January, 2012.. bare-metal stents to ramus and circumflex, 80% small second diagonal to be followed  /   . Aortic stenosis, moderate     Moderate.. echo.. January 2 012  . Aortic insufficiency     Mild... echo.. January, 2012  . Mitral regurgitation     EF 60%... echo... January, 2012,  mild MR, severe annular calcification  . HTN (hypertension)   . Diverticulosis   . Pulmonary HTN     67 mmHg.. echo.. January, 2012  . Renal arterial thrombosis     January, 2012 while on aspirin and Plavix for coronary stents ( atrial fibrillation) Plavix stopped and Coumadin started  . Renal insufficiency     Creatinine 1.4.. April 29, 2010  . Anemia     Hemoglobin 9.7, February, 2012  . Cirrhosis of liver     appearance On CT, January, 2012  . Drug therapy     Plavix used for bare-metal stent stopped after several weeks when Coumadin and started for renal  artery thrombus  . Atrial fibrillation     New diagnosis January, 2012, Coumadin not used initially because of Plavix and aspirin and age  for stents, renal artery thrombus, Plavix stopped Coumadin started April 23, 2010  . Drug therapy     Coumadin.Marland Kitchen started April 23, 2010 after renal artery embolus (atrial  fibrillation)  . Renal mass     Low density, upper pole left kidney, CT January 2 012, etiology unclear, MRI can be considered  . Renal artery stenosis     Suspected from abdominal CT January, 2012.. significant    Past Surgical History  Procedure Date  . Total abdominal hysterectomy   . Dilation and curettage of uterus   . Stints January 2012    2 stints     Allergies:  Allergies  Allergen Reactions  . Penicillins   . Sulfa Drugs Cross Reactors   . Sulfonamide Derivatives     Home Medications: Prior to Admission medications   Medication Sig Start Date End Date Taking? Authorizing Provider  ALPRAZolam Prudy Feeler) 0.5 MG tablet Take 0.5 mg by mouth 3 (three) times daily as needed.     Yes Historical Provider, MD  aspirin 81 MG tablet Take 81 mg by mouth daily.     Yes Historical Provider, MD  atorvastatin (LIPITOR) 10 MG tablet Take 10 mg by mouth daily. 06/01/11  Yes Luis Abed, MD  diltiazem (CARDIZEM CD) 240 MG 24 hr capsule  05/19/11  Yes Luis Abed, MD  metoprolol (LOPRESSOR) 50 MG tablet Take 50 mg by mouth 2 (two) times daily.   Yes Historical Provider, MD  oxyCODONE-acetaminophen (PERCOCET/ROXICET) 5-325 MG per tablet Take 1 tablet by mouth every 4 (four) hours as needed. pain   Yes Historical Provider, MD  potassium chloride (KLOR-CON) 10 MEQ CR tablet Take 10 mEq by mouth daily.    Yes Historical Provider, MD  Rivaroxaban (XARELTO) 20 MG TABS Take 20 mg by mouth daily.   Yes Historical Provider, MD  zolpidem (AMBIEN) 5 MG tablet Take 5 mg by mouth at bedtime as needed.     Yes Historical Provider, MD  nitroGLYCERIN (NITROSTAT) 0.4 MG SL tablet Place 0.4 mg under the tongue every 5 (five) minutes as needed.      Historical Provider, MD    Inpatient Medications:     . sodium chloride   Intravenous STAT  . diltiazem  300 mg Oral Daily  .  HYDROmorphone (DILAUDID) injection  0.5 mg Intravenous Once  . pantoprazole (PROTONIX) IV  40 mg Intravenous Q24H  . phytonadione   5 mg Subcutaneous Once  . sodium chloride  3 mL Intravenous Q12H  . DISCONTD: diltiazem  240 mg Oral Daily  . DISCONTD: pantoprazole (PROTONIX) IV  40 mg Intravenous Q12H  . DISCONTD: phytonadione (VITAMIN K) IV  5 mg Intravenous Once   Prescriptions prior to admission  Medication Sig Dispense Refill  . ALPRAZolam (XANAX) 0.5 MG tablet Take 0.5 mg by mouth 3 (three) times daily as needed.        Marland Kitchen aspirin 81 MG tablet Take 81 mg by mouth daily.        Marland Kitchen atorvastatin (LIPITOR) 10 MG tablet Take 10 mg by mouth daily.      Marland Kitchen diltiazem (CARDIZEM CD) 240 MG 24 hr capsule       . metoprolol (LOPRESSOR) 50 MG tablet Take 50 mg by mouth 2 (two) times daily.      Marland Kitchen oxyCODONE-acetaminophen (PERCOCET/ROXICET) 5-325 MG per tablet  Take 1 tablet by mouth every 4 (four) hours as needed. pain      . potassium chloride (KLOR-CON) 10 MEQ CR tablet Take 10 mEq by mouth daily.       . Rivaroxaban (XARELTO) 20 MG TABS Take 20 mg by mouth daily.      Marland Kitchen zolpidem (AMBIEN) 5 MG tablet Take 5 mg by mouth at bedtime as needed.        . nitroGLYCERIN (NITROSTAT) 0.4 MG SL tablet Place 0.4 mg under the tongue every 5 (five) minutes as needed.          Family History  Problem Relation Age of Onset  . Hypertension       History   Social History  . Marital Status: Single    Spouse Name: N/A    Number of Children: N/A  . Years of Education: N/A   Occupational History  . retired    Social History Main Topics  . Smoking status: Never Smoker   . Smokeless tobacco: Never Used  . Alcohol Use: No  . Drug Use: No  . Sexually Active: No   Other Topics Concern  . Not on file   Social History Narrative  . No narrative on file     Review of Systems: Complete ROS performed.  Had lower cramping, but not much abdominal pain.  No shortness of breath.   Physical Exam: Blood pressure 165/91, pulse 148, temperature 97.6 F (36.4 C), temperature source Oral, resp. rate 28, height 5\' 3"  (1.6 m), weight 70 kg (154  lb 5.2 oz), SpO2 97.00%.  Elderly, alert female in NAD.  No JVD. Lungs clear to AP Irregularly irregular rhythm with apical and LSE murmur.  Abdomen with BS.  No def tenderness. Multiple bruises.  No def edema.      Recent Labs  [facility] 11/01/11 0509 10/31/11 0805   WBC 8.4 11.7*   HGB 8.3* 9.8*   HCT 23.6* 30.7*   MCV 90.4 97.5   PLT 108* 212   No results found for this basename: VITAMINB12,FOLATE,FERRITIN,TIBC,IRON,RETICCTPCT in the last 72 hours No results found for this basename: DDIMER:2 in the last 72 hours  Lab 11/01/11 0509 10/31/11 0805  NA 138 138  K 4.3 4.5  CL 109 107  CO2 24 23  BUN 19 26*  CREATININE 0.87 0.99  CALCIUM 8.4 8.6  PROT -- 5.9*  BILITOT -- 0.5  ALKPHOS -- 65  ALT -- 18  AST -- 18  AMYLASE -- --  LIPASE -- --  GLUCOSE 98 103*   No results found for this basename: HGBA1C in the last 72 hours No results found for this basename: CKTOTAL:3,CKMB:3,CKMBINDEX:3,TROPONINI:3 in the last 72 hours No components found with this basename: POCBNP No results found for this basename: CHOL,HDL,LDLCALC,TRIG,CHOLHDL,LDLDIRECT in the last 72 hours No results found for this basename: TSH,T4TOTAL,FREET3,T3FREE,THYROIDAB in the last 72 hours  Radiology/Studies: Ct Abdomen Pelvis W Contrast  10/19/2011  *RADIOLOGY REPORT*  Clinical Data: Abdominal pain, constipation.  CT ABDOMEN AND PELVIS WITH CONTRAST  Technique:  Multidetector CT imaging of the abdomen and pelvis was performed following the standard protocol during bolus administration of intravenous contrast.  Contrast: 80mL OMNIPAQUE IOHEXOL 300 MG/ML  SOLN  Comparison: Ultrasound 10/16/2011  Findings: Lung bases are clear.  No pericardial fluid.  There is significant valvular calcification noted.  No focal hepatic lesion.  Gallbladder, pancreas, spleen, adrenal glands, and kidneys are normal.  There is a large calculus mid pole of the left kidney measuring  10 mm.  No evidence of obstruction. There is a low density  11 mm lesion in the mid left kidney which cannot be fully characterized on this scan but was demonstrated to be a simple cyst on comparison MRI.  There is bilateral renal cortical thinning.  The stomach, small bowel, colon are normal.  There are diverticula of the sigmoid colon.  There is moderate volume stool throughout the colon.  No evidence of obstruction.  Abdominal aorta normal caliber.  No retroperitoneal periportal lymphadenopathy.  No free fluid the pelvis.  The bladder is normal.  Post hysterectomy anatomy.  No pelvic lymphadenopathy.  The unilateral pars defect on the left with grade 1 anterolisthesis of L4-L5.  IMPRESSION:  1.  No acute abdominal or pelvic findings. 2.  Nonobstructing left renal calculus. 3.  Bilateral renal cortical thinning. 4.  Mitral valvular calcification. 5.  Sigmoid diverticulosis.  6.  Moderate volume stool burden throughout the colon. 7.  Unilateral pars defect at  L4 with grade 1 anterolisthesis.  Original Report Authenticated By: Genevive Bi, M.D.    ASSESSMENT:   1. Acute GI bleed 2. History of diverticulosis 3. Acute blood loss anemia/thrombocytopenia 4. Permanent atrial fibrillation + RVR 5. CAD 6. Valvular heart disease 7. Renal artery thrombosis 8. Hypertension  DISCUSSION/PLAN:  See below    Signed, R. Hurman Horn, PA-C 11/01/2011, 12:48 PM  Patient seen and examined and evaluated.  Case discussed with Dr. Myrtis Ser.  [name] has had prior CAD with PCI in 2012. [name] also AS, some MR, and pulmonary hypertension.   [name] then develop renal artery thrombosis, and was placed on anticoagulation for prophylaxis. Recently [name] was switched, I believe by her primary MD who does her INR.    However, [name] has developed diverticular bleed with significant hematochezia.   [name] has required transfusion of prbc as well as getting FFP  (which does not help Xarelto).   [name] now appears stable, and the xarelto, which has a fairly short half life, should be just about out of  her system.    Now with atrial fib with RVR, and the rate is faster.  [name] now is on IV diltiazem with reasonable rate control, but the dose will likely have to be increased tomorrow as the oral dose is held.  For now, [name] is rate controlled.  [name] is at significant risk for thromboembolic events, so as time goes forward and [name] stabilizes a decision will need to be made about the resumption presumably of warfarin.  Her heart rate should be controlled on her regular regimen once her hemodynamics are stablilized.    Our team will follow with you.

## 2011-11-01 NOTE — Progress Notes (Signed)
RBC bleeding scan was negative, INR down to 1.3, Hgb 10.5 after 5 units of pRBC's and 3 units of FFP. Stable hemodynamically. Continue to observe, possibly advance diet in am, continue to monitor H/H. Again, pt is not a surgical candidate but is she starts bleeding again will move to mesenteric arteriogram. Suspect diverticular bleed.

## 2011-11-01 NOTE — Progress Notes (Signed)
PROGRESS NOTE  [name] ZOX:[id number] DOB: [birth year] DOA: 10/31/2011 PCP: Abigail Miyamoto, MD?[name], FNP  Brief narrative 78 yr old lady with multiple Cardiac issues admit 8/6 with likely diverticular bleed to tele, Tx to SDU  Past medical history-As per Problem list Chart reviewed as below- NSTEMI 04/01/10 admit s/p PCI BMS to Ramus intermediau and prox L circ A fib Diag 1/6/132 admit Kept on Plavix initially, Changed to coumadin H./o Severe Pulm HTn on echo Mitral/aortic stenosis/insuff R renal atery thrombus, ? Embolic on 04/23/10 admit H/o Diverticulosis Noted low density L upper Kidney calcification? 1/12 ?Cirrhosis liver? Cholcystitis-Chronic-Sees Dr. Donell Beers  Consultants:  GI-Dr. Juanda Chance  Procedures:  None currently  Antibiotics:  none   Subjective  Still having dark stools with clots-status post 4 units rbc's No chest pain no nausea no vomiting no shortness of breath Not anxious   Objective    Interim History: Noted GI comment appreciate assistance  Telemetry: Sinus tachycardia/A. fib  Objective: Filed Vitals:   11/01/11 0314 11/01/11 0400 11/01/11 0500 11/01/11 0600  BP: 137/72 120/64 97/52 107/56  Pulse: 79 42 108 113  Temp: 98.5 F (36.9 C) 98.5 F (36.9 C)    TempSrc: Oral Oral    Resp: 27 18 21 21   Height:      Weight:   70 kg (154 lb 5.2 oz)   SpO2:  96% 96% 96%    Intake/Output Summary (Last 24 hours) at 11/01/11 0715 Last data filed at 11/01/11 0700  Gross per 24 hour  Intake 3662.5 ml  Output    325 ml  Net 3337.5 ml    Exam:  General: Pleasant alert oriented Caucasian female in no apparent distress Cardiovascular: S1-S2 tachycardic irregularly irregular soft murmur left sternal edge Respiratory: Clinically clear no added sound Abdomen: Soft nontender nondistended no rebound or guarding Skin no lower stroke edema Neuro motor grossly intact  Data Reviewed: Basic Metabolic Panel:  Lab 11/01/11 8119 10/31/11  0805  NA 138 138  K 4.3 4.5  CL 109 107  CO2 24 23  GLUCOSE 98 103*  BUN 19 26*  CREATININE 0.87 0.99  CALCIUM 8.4 8.6  MG -- --  PHOS -- --   Liver Function Tests:  Lab 10/31/11 0805  AST 18  ALT 18  ALKPHOS 65  BILITOT 0.5  PROT 5.9*  ALBUMIN 3.2*   No results found for this basename: LIPASE:5,AMYLASE:5 in the last 168 hours No results found for this basename: AMMONIA:5 in the last 168 hours CBC:  Lab 11/01/11 0509 10/31/11 0805  WBC 8.4 11.7*  NEUTROABS -- 9.4*  HGB 8.3* 9.8*  HCT 23.6* 30.7*  MCV 90.4 97.5  PLT 108* 212   Cardiac Enzymes: No results found for this basename: CKTOTAL:5,CKMB:5,CKMBINDEX:5,TROPONINI:5 in the last 168 hours BNP: No components found with this basename: POCBNP:5 CBG:  Lab 10/31/11 1655  GLUCAP 128*    Recent Results (from the past 240 hour(s))  MRSA PCR SCREENING     Status: Normal   Collection Time   10/31/11  5:37 PM      Component Value Range Status Comment   MRSA by PCR NEGATIVE  NEGATIVE Final      Studies:              All Imaging reviewed and is as per above notation   Scheduled Meds:   . sodium chloride   Intravenous Once  . sodium chloride   Intravenous STAT  . diltiazem  240 mg Oral Daily  .  HYDROmorphone (DILAUDID) injection  0.5 mg Intravenous Once  . pantoprazole (PROTONIX) IV  40 mg Intravenous Q24H  . phytonadione  5 mg Subcutaneous Once  . sodium chloride  3 mL Intravenous Q12H  . DISCONTD: pantoprazole (PROTONIX) IV  40 mg Intravenous Q12H  . DISCONTD: phytonadione (VITAMIN K) IV  5 mg Intravenous Once   Continuous Infusions:   . sodium chloride 75 mL/hr (11/01/11 0317)  . sodium chloride 500 mL/hr at 10/31/11 1739     Assessment/Plan: 1. Melena/hematochezia = status post 4 units packed red blood cells, 3 units FFP.  Hemodynamically still stable but potential for decompensation is present.  We will get another IV line and consult critical care for central access if needed--she is not for  endoscopy given increased risk to benefit 2. Rapid A. fib with RVR = will increase home dosage of diltiazem 240 mg to 300 mg this morning.  If needed we will start a diltiazem drip.  Her INR is 1.6 and this is in the process of actively being reversed.  I will consult cardiologist later today for further recommendations regarding anticoagulation given she has been on Plavix, Coumadin, Zaroxolyn and now may just benefit from aspirin in about 8 weeks after today's procedure. 3. Anemia blood loss acute-per #1 threshold for transfusion would be below 8 4. Hypertension with relative hypotensive episodes-was given Dilaudid last night-we will discontinue all opiates 5. Coronary artery disease/NSTEMI with stent 03/2010 = will consult cardiology for recommendations for #1 Atrial fibrillation, #2 anticoagulation subsequent to procedure. 6. History right renal artery embolus-will need some form anticoagulation-see above, potentially will discuss the same with urology once acute issues settle 7. History severe pulmonary hypertension, PA peak 67 mmHg 04/02/2010-monitored closely-atrial fibrillation could be related to this  Code Status: DO NOT RESUSCITATE Family Communication: None at bedside-updated patient personally asked to plan of care Disposition Plan: Step down status under [facility]   Pleas Koch, MD  [facility] Pager [phone] 11/01/2011, 7:15 AM    LOS: 1 day

## 2011-11-01 NOTE — Progress Notes (Signed)
Pt continues to bleed, after 4 units of pRBC's Hgb 8.7, passing blood clots, B.P 127/80, Pulse 135, Impression : diverticular bleed in supratherapeutic anticoagulation.   Plan FFP pending- 3rd unit  Infusing, then pRBC's,         RBC bleeding scan in Nuclear medicine ASAP  Lina Sar MD  [facility] Pager [phone]

## 2011-11-01 NOTE — Progress Notes (Signed)
CARE MANAGEMENT NOTE 11/01/2011  Patient:  [name], [name]   Account Number:  [id number]  Date Initiated:  11/01/2011  Documentation initiated by:  DAVIS,RHONDA  Subjective/Objective Assessment:   78 y.o. alert and active female with active gi bleed ongoing, history of nstemi with stent placement, admitted on acute floor transferred tosdu after hypotensive and active gi blding .     Action/Plan:   lives alone but does have 24 hour caregivers in the home   Anticipated DC Date:  11/04/2011   Anticipated DC Plan:  HOME/SELF CARE  In-house referral  NA      DC Planning Services  NA      [facility] Choice  NA   Choice offered to / List presented to:  NA   DME arranged  NA      DME agency  NA     HH arranged  NA      HH agency  NA   Status of service:  In process, will continue to follow Medicare Important Message given?  NA - LOS <3 / Initial given by admissions (If response is "NO", the following Medicare IM given date fields will be blank) Date Medicare IM given:   Date Additional Medicare IM given:    Discharge Disposition:    Per UR Regulation:  Reviewed for med. necessity/level of care/duration of stay  If discussed at Long Length of Stay Meetings, dates discussed:    Comments:  [id number]/Rhonda Earlene Plater, RN, BSN, CCM: CHART REVIEWED AND UPDATED. NO DISCHARGE NEEDS PRESENT AT THIS TIME. CASE MANAGEMENT [phone]

## 2011-11-01 NOTE — Clinical Documentation Improvement (Signed)
RENAL FAILURE DOCUMENTATION CLARIFICATION QUERY  THIS DOCUMENT IS NOT A PERMANENT PART OF THE MEDICAL RECORD  TO RESPOND TO THE THIS QUERY, FOLLOW THE INSTRUCTIONS BELOW:  1. If needed, update documentation for the patient's encounter via the notes activity.  2. Access this query again and click edit on the In [employer].  3. After updating, or not, click F2 to complete all highlighted (required) fields concerning your review. Select "additional documentation in the medical record" OR "no additional documentation provided".  4. Click Sign note button.  5. The deficiency will fall out of your In Basket *Please let us know if you are not able to complete this workflow by phone or e-mail (listed below).  Please update your documentation within the medical record to reflect your response to this query.                                                                                    11/01/11  Dear Craig Guess, J  In a better effort to capture your patient's severity of illness, reflect appropriate length of stay and utilization of resources, a review of the patient medical record has revealed the following indicators.    Based on your clinical judgment, please clarify and document in a progress note and/or discharge summary the clinical condition associated with the following supporting information:  In responding to this query please exercise your independent judgment.  The fact that a query is asked, does not imply that any particular answer is desired or expected.  Pt admitted with hematochezia and diverticulosis  According to HP pt with "renal insufficiency"   Please clarify whether "renal insufficiency" in setting of abnormal BUN/GFR can be further specified as as one of the diagnoses listed below and document in the pn or d/c summary.    Possible Clinical Conditions?  _______Acute Renal Failure _______Acute Kidney Injury _______Acute on Chronic Renal Failure ______CKD Stage  I -  GFR > OR = 90 ______CKD Stage II - GFR 60-80 ________CKD Stage III - GFR 30-59 _______CKD Stage IV - GFR 15-29 _______Other Condition_____________ _______Cannot Clinically Determine     Supporting Information:  Risk Factors: Hematochezia, Renal insufficiency, anemia, H/O Renal arterial thrombosis, Renal artery stenosis/Renal mass  Signs and Symptoms:  Diagnostics: Component     Latest Ref Rng 10/31/2011         8:05 AM  BUN     6 - 23 mg/dL 26 (H)   Component     Latest Ref Rng 10/31/2011         8:05 AM  GFR calc non Af Amer     >90 mL/min 48 (L)    Treatments: 0.9 %  sodium chloride infusion                 You may use possible, probable, or suspect with inpatient documentation. possible, probable, suspected diagnoses MUST be documented at the time of discharge  Reviewed:  no additional documentation provided ljh  Thank You,  Enis Slipper RN, BSN, MSN/Inf, CCDS Clinical Documentation Specialist [facility] Dept Pager: [phone] / E-mail: [email or web address]  Health Information Management [place]

## 2011-11-02 ENCOUNTER — Ambulatory Visit: Payer: Self-pay | Admitting: Cardiology

## 2011-11-02 DIAGNOSIS — I319 Disease of pericardium, unspecified: Secondary | ICD-10-CM

## 2011-11-02 LAB — CBC
MCH: 31.3 pg (ref 26.0–34.0)
MCHC: 34.4 g/dL (ref 30.0–36.0)
MCV: 90 fL (ref 78.0–100.0)
Platelets: 101 10*3/uL — ABNORMAL LOW (ref 150–400)
Platelets: 115 10*3/uL — ABNORMAL LOW (ref 150–400)
Platelets: 114 10*3/uL — ABNORMAL LOW (ref 150–400)
RBC: 3.35 MIL/uL — ABNORMAL LOW (ref 3.87–5.11)
RDW: 17.6 % — ABNORMAL HIGH (ref 11.5–15.5)
RDW: 17.4 % — ABNORMAL HIGH (ref 11.5–15.5)
WBC: 9 10*3/uL (ref 4.0–10.5)

## 2011-11-02 LAB — PREPARE FRESH FROZEN PLASMA
Unit division: 0
Unit division: 0

## 2011-11-02 LAB — COMPREHENSIVE METABOLIC PANEL
AST: 18 U/L (ref 0–37)
Albumin: 2.9 g/dL — ABNORMAL LOW (ref 3.5–5.2)
Alkaline Phosphatase: 58 U/L (ref 39–117)
BUN: 13 mg/dL (ref 6–23)
CO2: 24 mEq/L (ref 19–32)
Chloride: 106 mEq/L (ref 96–112)
Potassium: 3.9 mEq/L (ref 3.5–5.1)
Total Bilirubin: 1 mg/dL (ref 0.3–1.2)

## 2011-11-02 LAB — TYPE AND SCREEN
ABO/RH(D): A POS
Unit division: 0

## 2011-11-02 LAB — PROTIME-INR
INR: 1.23 (ref 0.00–1.49)
Prothrombin Time: 15.8 seconds — ABNORMAL HIGH (ref 11.6–15.2)

## 2011-11-02 MED ORDER — OXYCODONE-ACETAMINOPHEN 5-325 MG PO TABS
1.0000 | ORAL_TABLET | Freq: Three times a day (TID) | ORAL | Status: DC | PRN
  Administered 2011-11-02 – 2011-11-04 (×5): 1 via ORAL
  Filled 2011-11-02 (×6): qty 1

## 2011-11-02 MED ORDER — BOOST / RESOURCE BREEZE PO LIQD
1.0000 | Freq: Every day | ORAL | Status: DC
  Administered 2011-11-02: 1 via ORAL

## 2011-11-02 NOTE — Progress Notes (Signed)
INITIAL ADULT NUTRITION ASSESSMENT Date: 11/02/2011   Time: 3:08 PM Reason for Assessment: Nutrition Risk for unintentional weight loss > 10 lb over 1 month  ASSESSMENT: Female 78 y.o.  Dx: Hematochezia  Hx:  Past Medical History  Diagnosis Date  . CAD (coronary artery disease)     Non-STEMI January, 2012.. bare-metal stents to ramus and circumflex, 80% small second diagonal to be followed  /   . Aortic stenosis, moderate     Moderate.. echo.. January 2 012  . Aortic insufficiency     Mild... echo.. January, 2012  . Mitral regurgitation     EF 60%... echo... January, 2012,  mild MR, severe annular calcification  . HTN (hypertension)   . Diverticulosis   . Pulmonary HTN     67 mmHg.. echo.. January, 2012  . Renal arterial thrombosis     January, 2012 while on aspirin and Plavix for coronary stents ( atrial fibrillation) Plavix stopped and Coumadin started  . Renal insufficiency     Creatinine 1.4.. April 29, 2010  . Anemia     Hemoglobin 9.7, February, 2012  . Cirrhosis of liver     appearance On CT, January, 2012  . Drug therapy     Plavix used for bare-metal stent stopped after several weeks when Coumadin and started for renal artery thrombus  . Atrial fibrillation     New diagnosis January, 2012, Coumadin not used initially because of Plavix and aspirin and age  for stents, renal artery thrombus, Plavix stopped Coumadin started April 23, 2010  . Drug therapy     Coumadin.Marland Kitchen started April 23, 2010 after renal artery embolus (atrial fibrillation)  . Renal mass     Low density, upper pole left kidney, CT January 2 012, etiology unclear, MRI can be considered  . Renal artery stenosis     Suspected from abdominal CT January, 2012.. significant    Related Meds:  Scheduled Meds:   . diltiazem  60 mg Oral Q4H  . pantoprazole (PROTONIX) IV  40 mg Intravenous Q24H  . sodium chloride  3 mL Intravenous Q12H   Continuous Infusions:   . sodium chloride 100 mL/hr at  11/02/11 1346  . DISCONTD: diltiazem (CARDIZEM) infusion Stopped (11/01/11 2050)   PRN Meds:.ALPRAZolam, nitroGLYCERIN, ondansetron (ZOFRAN) IV, ondansetron, oxyCODONE-acetaminophen, zolpidem   Ht: 5\' 3"  (160 cm)  Wt: 154 lb 5.2 oz (70 kg)  Ideal Wt: 52.27 kg % Ideal Wt: 134% Wt Readings from Last 10 Encounters:  11/01/11 154 lb 5.2 oz (70 kg)  10/18/11 143 lb (64.864 kg)  05/01/11 149 lb (67.586 kg)  09/23/10 151 lb (68.493 kg)  06/17/10 154 lb (69.854 kg)  05/17/10 154 lb (69.854 kg)  04/19/10 159 lb (72.122 kg)    BMI: 27.27 kg/m^2 (Overweight)  Food/Nutrition Related Hx: Patient reported her appetite and intake have been well. She reported she was eating well PTA. PO intake documented 50% at meals. Patient agreed to try resource breeze nutrition supplement.   Labs:  CMP     Component Value Date/Time   NA 138 11/02/2011 0636   K 3.9 11/02/2011 0636   CL 106 11/02/2011 0636   CO2 24 11/02/2011 0636   GLUCOSE 101* 11/02/2011 0636   BUN 13 11/02/2011 0636   CREATININE 0.82 11/02/2011 0636   CALCIUM 8.3* 11/02/2011 0636   PROT 5.3* 11/02/2011 0636   ALBUMIN 2.9* 11/02/2011 0636   AST 18 11/02/2011 0636   ALT 12 11/02/2011 0636   ALKPHOS 58 11/02/2011 0636  BILITOT 1.0 11/02/2011 0636   GFRNONAA 61* 11/02/2011 0636   GFRAA 70* 11/02/2011 0636    Intake/Output Summary (Last 24 hours) at 11/02/11 1510 Last data filed at 11/02/11 1340  Gross per 24 hour  Intake 1767.17 ml  Output   1976 ml  Net -208.83 ml     Diet Order: Full Liquid  Supplements/Tube Feeding: none at this time  IVF:    sodium chloride Last Rate: 100 mL/hr at 11/02/11 1346  DISCONTD: diltiazem (CARDIZEM) infusion Last Rate: Stopped (11/01/11 2050)    Estimated Nutritional Needs:   Kcal: 1500-1700 Protein: 105 grams  Fluid: 1 ml per kcal intake  NUTRITION DIAGNOSIS: -Inadequate oral intake (NI-2.1).  Status: Ongoing  RELATED TO: diet restriction   AS EVIDENCE BY: pt with PO intake 50% of clear liquid  diet  MONITORING/EVALUATION(Goals): Diet advancements/ tolerance, weights, labs, PO intake 1. PO intake > 75% at ,meals and supplements.  2. Diet advancements as medically able. -Advanced from clear to full today.   EDUCATION NEEDS: -No education needs identified at this time  INTERVENTION: 1. Encouraged PO intake. Will order pt [employer] breeze once daily to promote increased intake. Provides 250 kcal and 9 grams of protein daily.  2. RD to follow for nutrition plan of care.   Dietitian [phone]  DOCUMENTATION CODES Per approved criteria  -Not Applicable    Iven Finn [facility] 11/02/2011, 3:08 PM

## 2011-11-02 NOTE — Progress Notes (Signed)
    SUBJECTIVE: Only one stool over last 18 hours. No chest pain or SOB.   BP 100/57  Pulse 79  Temp 98.7 F (37.1 C) (Oral)  Resp 21  Ht 5\' 3"  (1.6 m)  Wt 154 lb 5.2 oz (70 kg)  BMI 27.34 kg/m2  SpO2 93%  Intake/Output Summary (Last 24 hours) at 11/02/11 4540 Last data filed at 11/02/11 0600  Gross per 24 hour  Intake 3060.17 ml  Output   1755 ml  Net 1305.17 ml    PHYSICAL EXAM General: Well developed, well nourished, in no acute distress. Alert and oriented x 3.  Psych:  Good affect, responds appropriately Neck: No JVD. No masses noted.  Lungs: Clear bilaterally with no wheezes or rhonci noted.  Heart: RRR with systolic murmur noted.  Abdomen: Bowel sounds are present. Soft, non-tender.  Extremities: No lower extremity edema.   LABS: Basic Metabolic Panel:  Basename 11/01/11 0509 10/31/11 0805  NA 138 138  K 4.3 4.5  CL 109 107  CO2 24 23  GLUCOSE 98 103*  BUN 19 26*  CREATININE 0.87 0.99  CALCIUM 8.4 8.6  MG -- --  PHOS -- --   CBC:  Basename 11/01/11 2230 11/01/11 1456 10/31/11 0805  WBC 8.6 8.9 --  NEUTROABS -- -- 9.4*  HGB 10.2* 10.5* --  HCT 29.1* 30.6* --  MCV 89.0 89.5 --  PLT 114* 115* --    Current Meds:    . diltiazem  60 mg Oral Q4H  . pantoprazole (PROTONIX) IV  40 mg Intravenous Q24H  . sodium chloride  3 mL Intravenous Q12H  . DISCONTD: diltiazem  240 mg Oral Daily  . DISCONTD: diltiazem  300 mg Oral Daily     ASSESSMENT AND PLAN: 78 yo female with history of CAD, permanent atrial fibrillation, aortic stenosis, mitral regurgitation, HTN who had been on chronic anti-coagulation and was admitted with GI bleeding, now s/p multiple transfusions. Cardiology consulted for management of atrial fibrillation.   1. Atrial fibrillation:  Rate controlled this am on po Cardizem being dosed Q4 hours. Her home meds include Cardizem CD 240 mg po Qdaily and Metoprolol 50 mg po BID. She is still hypotensive with recent blood loss. Continue short  acting diltiazem today and when BP can tolerate, resume home meds. I would not recommend anti-coagulation in the future with life threatening GI bleeding in this 78 yo female. Will discuss with Dr. Myrtis Ser her primary cardiologist.   2. Anemia: per primary team, GI consult.   MCALHANY,CHRISTOPHER  8/8/20136:52 AM

## 2011-11-02 NOTE — Progress Notes (Signed)
  Echocardiogram 2D Echocardiogram has been performed.  [name] 11/02/2011, 11:07 AM

## 2011-11-02 NOTE — Progress Notes (Signed)
[facility] follow-up progress note (same day) Spoke with Dr. Vernie Ammons (urologist) who-recommends repeat ECHO to demonstrate no potential in heart chambers for recurring embolus and does not think at this stage patient will report any anticoagulation from his standpoint. Appreciate input.  Pleas Koch, MD [facility] [phone]

## 2011-11-02 NOTE — Progress Notes (Signed)
Patient ID: [name], female   DOB: 1919/01/24, 77 y.o.   MRN: [id number] [facility] Progress Note  Subjective:  In good spirits, no further  Bleeding or BM's since yesterday at lunch . Feels better. Undergoing ECHO  Etc. To determine if acceptable for her to remain off anticoagulation. Records reviewed- she did have a renal artery thrombus in Jan 2012 while on ASA and plavix. Objective:  Vital signs in last 24 hours: Temp:  [97.6 F (36.4 C)-99 F (37.2 C)] 99 F (37.2 C) (08/08 0800) Pulse Rate:  [78-148] 95  (08/08 0900) Resp:  [20-29] 23  (08/08 0900) BP: (100-167)/(40-121) 105/81 mmHg (08/08 0834) SpO2:  [90 %-97 %] 94 % (08/08 0900) Last BM Date: 11/01/11 General:   Alert,  Well-developed,    in NAD Heart:  Regular rate and rhythm; harsh murmur systolic Pulm;clear Abdomen:  Soft, nontender and nondistended. Normal bowel sounds, without guarding,   Extremities:  Without edema. Neurologic:  Alert and  oriented x4;  grossly normal neurologically. Psych:  Alert and cooperative. Normal mood and affect.  Intake/Output from previous day: 08/07 0701 - 08/08 0700 In: 2985.2 [P.O.:240; I.V.:2384.2; Blood:357; IV Piggyback:4] Out: 4098 [JXBJY:7829; Stool:3] Intake/Output this shift:    Lab Results:  Basename 11/02/11 0636 11/01/11 2230 11/01/11 1456  WBC 8.9 8.6 8.9  HGB 10.3* 10.2* 10.5*  HCT 29.6* 29.1* 30.6*  PLT 101* 114* 115*   BMET  Basename 11/02/11 0636 11/01/11 0509 10/31/11 0805  NA 138 138 138  K 3.9 4.3 4.5  CL 106 109 107  CO2 24 24 23   GLUCOSE 101* 98 103*  BUN 13 19 26*  CREATININE 0.82 0.87 0.99  CALCIUM 8.3* 8.4 8.6   LFT  Basename 11/02/11 0636  PROT 5.3*  ALBUMIN 2.9*  AST 18  ALT 12  ALKPHOS 58  BILITOT 1.0  BILIDIR --  IBILI --   PT/INR  Basename 11/02/11 0636 11/01/11 1458  LABPROT 15.8* 16.7*  INR 1.23 1.33     Assessment / Plan: #1 78 yo female with acute major GI bleed in setting of anticoagulation with  Coumadin and Xarelto overlap. Bleeding scan negative yesterday and no bleeding since. Presumed diverticular. Do not plan endoscopic eval  At this time Advance diet #2 Atrial fib, CAD  S/p stent 2012, then renal artery thrombus-  Anticoagulation issue being sorted out by cardiology  #3 anemia- secondary to acute blood loss- stable after several units of PRBC's. Principal Problem:  *Hematochezia Active Problems:  A-fib  CAD (coronary artery disease)  Aortic stenosis, moderate  HTN (hypertension)  Renal insufficiency  Anemia  Diverticulosis  GIB (gastrointestinal bleeding)  Coagulopathy     LOS: 2 days   Amy Esterwood  11/02/2011, 10:33 AM  I have reviewed the above note, examined the patient and agree with plan of treatment. Last stool 24 hours ago, hemodynamically stable, ,wants to eat, Hgb 10.2. S/p major LGIB likely diverticular. We will not pursue colonoscopy due to age. Full liquids ordered, may be advanced as tolerated  [facility] Pager # [phone]

## 2011-11-02 NOTE — Progress Notes (Signed)
Name: [name] MRN: [id number] DOB: [birth year]    LOS: 2  History of Present Illness:  78 yo with a medical hx that includes CAD, Afib, Mitral regurg, aortic stenosis, renal arterial thrombosis, diverticulosis HTN who presents to ED on 8/6 w/ cc BRBPR. Reports has been on coumadin until 10/27/11 when she requested being taken off as she would like to eat vegetables. She was changed to xarelto. She also reports seeing Dr. Rhea Belton about 4 weeks ago for abdominal pain/constipation and had a scope at that time. She denies dizziness, sob, CP or palpitations. Symptoms came on suddenly and  persisted.  In ed Hg 9.8 and INR 2.99. She was admitted to reg med bed. During the PM hours on 8/6 had near syncopal event after large maroon colored BM. Received 3 units of RBCs and also 3 FFP from 8/6 to 8/7. Source felt to be diverticular bleed in setting of coagulopathy on xarelto. PCCM was asked to evaluate given concern for high risk for decompensation.   Lines / Drains:  Cultures: n/a  Antibiotics: N/a  Tests / Events: Bleeding scan 8/7>>> No scintigraphic evidence for active gastrointestinal hemorrhage.  Vital Signs: BP 105/81  Pulse 95  Temp 99 F (37.2 C) (Oral)  Resp 23  Ht 5\' 3"  (1.6 m)  Wt 70 kg (154 lb 5.2 oz)  BMI 27.34 kg/m2  SpO2 94% Room air       . sodium chloride 100 mL/hr (11/02/11 0400)  . DISCONTD: diltiazem (CARDIZEM) infusion Stopped (11/01/11 2050)     Intake/Output Summary (Last 24 hours) at 11/02/11 1128 Last data filed at 11/02/11 0600  Gross per 24 hour  Intake 1978.17 ml  Output   1705 ml  Net 273.17 ml    Physical Examination: General:  No acute distress.  Neuro:  Awake, alert, no distress.  HEENT:  [place], no JVD Cardiovascular:  Irregular irregular AF w/ RVR on tele Lungs:  Clear to auscultation  Abdomen:  Non-tender Musculoskeletal:  intact Skin:  intact  Ventilator settings:    Labs and Imaging:   Lab 11/02/11 0636 11/01/11 0509 10/31/11 0805    NA 138 138 138  K 3.9 4.3 4.5  CL 106 109 107  CO2 24 24 23   BUN 13 19 26*  CREATININE 0.82 0.87 0.99  GLUCOSE 101* 98 103*    Lab 11/02/11 0636 11/01/11 2230 11/01/11 1456  HGB 10.3* 10.2* 10.5*  HCT 29.6* 29.1* 30.6*  WBC 8.9 8.6 8.9  PLT 101* 114* 115*   Lab Results  Component Value Date   INR 1.23 11/02/2011   INR 1.33 11/01/2011   INR 1.61* 11/01/2011    Assessment and Plan:  GIB (gastrointestinal bleeding), w/ Acute Blood loss anemia: felt to be diverticular bleed in setting of drug induced coagulopathy (xarelto).  Stable clinically, stable Hgb after PRBC and correction coags.  Plan: -f/u CBC -hold off from central access as she has a reassuring bleeding scan and her BP is stable.  -think this means no more anticoagulation in the future >> TTE pending to insure OK to stop xarelto permanently  -OK to transfer to floor from CCM perspective   A-fib w/ RVR, w/ h/o CAD (coronary artery disease) and AS: Improved rate control, dilt gtt off  Plan: -rate control, agree w/ CCB as long as she can tolerate it hemodynamically.  -TTE as above -further recs per cards.    Renal insufficiency Recent Labs  [facility] 11/02/11 0636 11/01/11 0509 10/31/11 0805   CREATININE  0.82 0.87 0.99  Plan: Monitor  Best practices / Disposition: -->SDU: [facility] pccm and GI are consults.  -->full code -->PAS for DVT Px -->Protonix for GI Px -->diet clears -->family updated at bedside  CCM will sign off, please call if we can help in any way   Levy Pupa, MD, PhD 11/02/2011, 11:28 AM [facility] [phone] or if no answer [phone]

## 2011-11-02 NOTE — Progress Notes (Signed)
PROGRESS NOTE  [name] ZOX:[id number] DOB: Aug 06, 1918 DOA: 10/31/2011 PCP: Abigail Miyamoto, MD?Kimberly Anner Crete, FNP  Brief narrative 78 yr old lady with multiple Cardiac issues admit 8/6 with likely diverticular bleed to tele, Tx to SDU, received 5 units total packed red blood cells, 3 units of FFP and received a bleeding scan which was non-revealing for any acute bleed.  Cardiology and critical care were consulted regarding recommendations for #1 atrial fib #2 potential physiologic decompensation and patient did not decompensate and physiologically stabilized.  Past medical history-As per Problem list Chart reviewed as below- NSTEMI 04/01/10 admit s/p PCI BMS to Ramus intermediau and prox L circ A fib Diag 1/6/132 admit Kept on Plavix initially, Changed to coumadin H/o Severe Pulm HTN on echo Mitral/aortic stenosis/insuff R renal atery thrombus, ? Embolic on 04/23/10 admit H/o Diverticulosis Noted low density L upper Kidney calcification? 1/12 ?Cirrhosis liver? Cholcystitis-Chronic-Sees Dr. Donell Beers  Consultants:  GI-Dr. Juanda Chance  Cards-Stuckey/Mcalhany  Critical care-[place]  Procedures:  Bleeding scan 11/01/2011 = no scintigraphic evidence for active gastrointestinal hemorrhage  Antibiotics:  none   Subjective  No further dark stools with clots No chest pain no nausea no vomiting no shortness of breath Not anxious Looks better and does not seem to be in any distress. Her caregiver is at bedside. She was able to get up to the bedside commode today without dizziness Orthostatics to be done today   Objective    Interim History: Noted GI comment appreciate assistance Appreciate Cardiology involvement Appreciate PCCM involvement  Telemetry: Rate controlled Afib  Objective: Filed Vitals:   11/02/11 0400 11/02/11 0500 11/02/11 0600 11/02/11 0800  BP: 106/47 110/54 100/57   Pulse: 79 85 79   Temp: 98.7 F (37.1 C)   99 F (37.2 C)  TempSrc: Oral   Oral  Resp:  27 22 21    Height:      Weight:      SpO2: 91% 92% 93%     Intake/Output Summary (Last 24 hours) at 11/02/11 0813 Last data filed at 11/02/11 0600  Gross per 24 hour  Intake 2910.17 ml  Output   1855 ml  Net 1055.17 ml    Exam:  General: Pleasant alert oriented Caucasian female in no apparent distress Cardiovascular: S1-S2 tachycardic irregularly irregular soft murmur left sternal edge Respiratory: Clinically clear no added sound Abdomen: Soft nontender nondistended no rebound or guarding Skin no lower stroke edema Neuro motor grossly intact  Data Reviewed: Basic Metabolic Panel:  Lab 11/02/11 8119 11/01/11 0509 10/31/11 0805  NA 138 138 138  K 3.9 4.3 --  CL 106 109 107  CO2 24 24 23   GLUCOSE 101* 98 103*  BUN 13 19 26*  CREATININE 0.82 0.87 0.99  CALCIUM 8.3* 8.4 8.6  MG -- -- --  PHOS -- -- --   Liver Function Tests:  Lab 11/02/11 0636 10/31/11 0805  AST 18 18  ALT 12 18  ALKPHOS 58 65  BILITOT 1.0 0.5  PROT 5.3* 5.9*  ALBUMIN 2.9* 3.2*   No results found for this basename: LIPASE:5,AMYLASE:5 in the last 168 hours No results found for this basename: AMMONIA:5 in the last 168 hours CBC:  Lab 11/02/11 0636 11/01/11 2230 11/01/11 1456 11/01/11 0509 10/31/11 0805  WBC 8.9 8.6 8.9 8.4 11.7*  NEUTROABS -- -- -- -- 9.4*  HGB 10.3* 10.2* 10.5* 8.3* 9.8*  HCT 29.6* 29.1* 30.6* 23.6* 30.7*  MCV 90.0 89.0 89.5 90.4 97.5  PLT 101* 114* 115* 108* 212  Cardiac Enzymes: No results found for this basename: CKTOTAL:5,CKMB:5,CKMBINDEX:5,TROPONINI:5 in the last 168 hours BNP: No components found with this basename: POCBNP:5 CBG:  Lab 10/31/11 1655  GLUCAP 128*    Recent Results (from the past 240 hour(s))  MRSA PCR SCREENING     Status: Normal   Collection Time   10/31/11  5:37 PM      Component Value Range Status Comment   MRSA by PCR NEGATIVE  NEGATIVE Final      Studies:              All Imaging reviewed and is as per above notation   Scheduled  Meds:    . diltiazem  60 mg Oral Q4H  . pantoprazole (PROTONIX) IV  40 mg Intravenous Q24H  . sodium chloride  3 mL Intravenous Q12H  . DISCONTD: diltiazem  240 mg Oral Daily  . DISCONTD: diltiazem  300 mg Oral Daily   Continuous Infusions:    . sodium chloride 100 mL/hr (11/02/11 0400)  . diltiazem (CARDIZEM) infusion Stopped (11/01/11 2050)     Assessment/Plan: 1. Hypovolemic shock-secondary to #2 see below 2. Melena/hematochezia = status post 5 units packed red blood cells, 3 units FFP.  Hemodynamically stable currently-with hemoglobin stabilizing at 10.3-will recheck every 12x24 hours.  I have graduated to a soft diet from clear liquids.  3. Rapid A. fib with RVR = diltiazem drip discontinued 11/01/2011 p.m. Appreciate cardiology assistance-continue diltiazem 60 every 4, converted to long-acting tomorrow.  Her INR is 1.2.  Unclear whether patient will have her be able to take anticoagulation again-needs to be addressed with primary care physician and primary cardiologist given she has elevated [place] score and #6 4. Anemia blood loss acute-per #1 threshold for transfusion would be below 8. 5. Hypertension with relative hypotensive episodes-multifactorial-likely secondary to the + Dilaudid 6. Coronary artery disease/NSTEMI with stent 03/2010 = will consult cardiology for recommendations for #1 Atrial fibrillation, #2 anticoagulation subsequent to procedure. 7. History right renal artery embolus-will need some form anticoagulation-see above.  Will involve Urologist in discussion 11/02/11 8. History severe pulmonary hypertension, PA peak 67 mmHg 04/02/2010-monitored closely-atrial fibrillation could be related to this  Code Status: DO NOT RESUSCITATE Family Communication: None at bedside-updated patient personally asked to plan of care Disposition Plan: Step down status under [facility]-potetntial Tx to Tele floor if #1-no active bleed today, #2 Can tolerate being OOB with Pt/OT   Pleas Koch,  MD  [facility] Pager [phone] 11/02/2011, 8:13 AM    LOS: 2 days

## 2011-11-03 DIAGNOSIS — N2889 Other specified disorders of kidney and ureter: Secondary | ICD-10-CM

## 2011-11-03 DIAGNOSIS — I2510 Atherosclerotic heart disease of native coronary artery without angina pectoris: Secondary | ICD-10-CM

## 2011-11-03 LAB — CBC
HCT: 31.3 % — ABNORMAL LOW (ref 36.0–46.0)
HCT: 28.2 % — ABNORMAL LOW (ref 36.0–46.0)
Hemoglobin: 9.8 g/dL — ABNORMAL LOW (ref 12.0–15.0)
MCH: 30.7 pg (ref 26.0–34.0)
MCH: 31.5 pg (ref 26.0–34.0)
MCH: 31.1 pg (ref 26.0–34.0)
MCHC: 34.8 g/dL (ref 30.0–36.0)
MCV: 89.9 fL (ref 78.0–100.0)
MCV: 91.4 fL (ref 78.0–100.0)
Platelets: 125 10*3/uL — ABNORMAL LOW (ref 150–400)
Platelets: 121 10*3/uL — ABNORMAL LOW (ref 150–400)
RBC: 3.48 MIL/uL — ABNORMAL LOW (ref 3.87–5.11)
RBC: 3.25 MIL/uL — ABNORMAL LOW (ref 3.87–5.11)
RDW: 17.3 % — ABNORMAL HIGH (ref 11.5–15.5)
RDW: 17.2 % — ABNORMAL HIGH (ref 11.5–15.5)
RDW: 16.8 % — ABNORMAL HIGH (ref 11.5–15.5)
WBC: 8.3 10*3/uL (ref 4.0–10.5)

## 2011-11-03 MED ORDER — METOPROLOL TARTRATE 50 MG PO TABS
50.0000 mg | ORAL_TABLET | Freq: Two times a day (BID) | ORAL | Status: DC
  Administered 2011-11-03 – 2011-11-04 (×3): 50 mg via ORAL
  Filled 2011-11-03 (×4): qty 1

## 2011-11-03 MED ORDER — DOCUSATE SODIUM 100 MG PO CAPS
100.0000 mg | ORAL_CAPSULE | Freq: Every day | ORAL | Status: DC
  Administered 2011-11-03 – 2011-11-04 (×2): 100 mg via ORAL
  Filled 2011-11-03 (×2): qty 1

## 2011-11-03 MED ORDER — DILTIAZEM HCL ER COATED BEADS 240 MG PO CP24
240.0000 mg | ORAL_CAPSULE | Freq: Every day | ORAL | Status: DC
  Administered 2011-11-03 – 2011-11-04 (×2): 240 mg via ORAL
  Filled 2011-11-03 (×2): qty 1

## 2011-11-03 NOTE — Progress Notes (Signed)
Subjective No complaints. Worried about being constipated, no stool x 36 hours  Objective: no further bleeding, Hgb stable at 10.7, tolerating full liquids Vital signs in last 24 hours: Temp:  [97.4 F (36.3 C)-98.8 F (37.1 C)] 97.4 F (36.3 C) (08/09 0541) Pulse Rate:  [28-173] 70  (08/09 0541) Resp:  [19-31] 19  (08/09 0541) BP: (105-170)/(55-92) 170/92 mmHg (08/09 0553) SpO2:  [91 %-94 %] 91 % (08/09 0541) Weight:  [148 lb 9.4 oz (67.4 kg)] 148 lb 9.4 oz (67.4 kg) (08/09 0500) Last BM Date: 11/02/11 General:   Alert,  pleasant, cooperative in NAD Head:  Normocephalic and atraumatic. Eyes:  Sclera clear, no icterus.   Conjunctiva pink. Mouth:  No deformity or lesions, dentition normal. Neck:  Supple; no masses or thyromegaly. Heart:  Regular rate and rhythm; no murmurs, clicks, rubs,  or gallops. Lungs:  No wheezes or rales Abdomen:  *soft, non tender, normal bowl sounds, no mass Msk:  Symmetrical without gross deformities. Normal posture. Pulses:  Normal pulses noted. Extremities:  Without clubbing or edema. Neurologic:  Alert and  oriented x4;  grossly normal neurologically. Skin:  Intact without significant lesions or rashes.  Intake/Output from previous day: 08/08 0701 - 08/09 0700 In: 2629.7 [P.O.:50; I.V.:2479.7; IV Piggyback:100] Out: 525 [Urine:525] Intake/Output this shift:    Lab Results:  Basename 11/03/11 0605 11/02/11 2218 11/02/11 1411  WBC 8.3 9.0 9.2  HGB 10.7* 10.4* 10.4*  HCT 31.3* 29.8* 30.2*  PLT 125* 114* 115*   BMET  Basename 11/02/11 0636 11/01/11 0509 10/31/11 0805  NA 138 138 138  K 3.9 4.3 4.5  CL 106 109 107  CO2 24 24 23   GLUCOSE 101* 98 103*  BUN 13 19 26*  CREATININE 0.82 0.87 0.99  CALCIUM 8.3* 8.4 8.6   LFT  Basename 11/02/11 0636  PROT 5.3*  ALBUMIN 2.9*  AST 18  ALT 12  ALKPHOS 58  BILITOT 1.0  BILIDIR --  IBILI --   PT/INR  Basename 11/02/11 0636 11/01/11 1458  LABPROT 15.8* 16.7*  INR 1.23 1.33    Hepatitis Panel No results found for this basename: HEPBSAG,HCVAB,HEPAIGM,HEPBIGM in the last 72 hours  Studies/Results: Nm Gi Blood Loss  11/01/2011  *RADIOLOGY REPORT*  Clinical Data: Large volume hematochezia.  Anemia.  NUCLEAR MEDICINE GASTROINTESTINAL BLEEDING STUDY  Technique:  Sequential abdominal images were obtained following intravenous administration of Tc-59m labeled red blood cells.  Radiopharmaceutical: CURIE 3mTc04 SODIUM PERTECHNETATE TC 11M INJECTION  Comparison: None.  Findings: Normal blood pool activity is evident.  No findings of active ongoing gastrointestinal blood loss after 2 hours of observation.  IMPRESSION: No scintigraphic evidence for active gastrointestinal hemorrhage.  Original Report Authenticated By: ERIC A. MANSELL, M.D.     ASSESSMENT:   Principal Problem:  *Hematochezia Active Problems:  A-fib  CAD (coronary artery disease)  Aortic stenosis, moderate  HTN (hypertension)  Renal insufficiency  Anemia  Diverticulosis  GIB (gastrointestinal bleeding)  Coagulopathy     PLAN:   Post large volume LGIB likely diverticular, now stable off anticoagulants Dr Clifton James suggest staying off anticoag till she sees her cardiologist Dr Myrtis Ser Advance to low residue diet Will sign off     LOS: 3 days   Lina Sar  11/03/2011, 8:03 AM

## 2011-11-03 NOTE — Progress Notes (Signed)
PROGRESS NOTE  [name] WUJ:[id number] DOB: Mar 01, 1919 DOA: 10/31/2011 PCP: Abigail Miyamoto, MD?[name], FNP  Brief narrative 78 yr old lady with multiple Cardiac issues admit 8/6 with likely diverticular bleed to tele, Tx to SDU, received 5 units total packed red blood cells, 3 units of FFP and received a bleeding scan which was non-revealing for any acute bleed.  Cardiology and critical care were consulted regarding recommendations for #1 atrial fib #2 potential physiologic decompensation and patient did not decompensate and physiologically stabilized.  Past medical history-As per Problem list Chart reviewed as below- NSTEMI 04/01/10 admit s/p PCI BMS to Ramus intermediau and prox L circ A fib Diag 1/6/132 admit Kept on Plavix initially, Changed to coumadin H/o Severe Pulm HTN on echo Mitral/aortic stenosis/insuff R renal atery thrombus, ? Embolic on 04/23/10 admit H/o Diverticulosis Noted low density L upper Kidney calcification? 1/12 ?Cirrhosis liver? Cholcystitis-Chronic-Sees Dr. Donell Beers  Consultants:  GI-Dr. Juanda Chance  Cards-Stuckey/Mcalhany  Critical care-[facility]  Procedures:  Bleeding scan 11/01/2011 = no scintigraphic evidence for active gastrointestinal hemorrhage  Antibiotics:  none   Subjective  Constipated.  Wants laxative  No chest pain no nausea no vomiting no shortness of breath Not anxious Looks better and does not seem to be in any distress. Her caregiver is at bedside. Slight SOB with ambulation    Objective    Interim History: Noted GI comment appreciate assistance-Appr input Appreciate Cardiology involvement-NOted changed Cardizem/Added back Metoprolol 8/9 Appreciate PCCM involvement  Telemetry: Rate controlled Afib  Objective: Filed Vitals:   11/03/11 0541 11/03/11 0542 11/03/11 0552 11/03/11 0553  BP: 122/69 129/80 170/77 170/92  Pulse: 70     Temp: 97.4 F (36.3 C)     TempSrc: Oral     Resp: 19     Height:      Weight:       SpO2: 91%       Intake/Output Summary (Last 24 hours) at 11/03/11 1143 Last data filed at 11/03/11 9562  Gross per 24 hour  Intake 2469.67 ml  Output    525 ml  Net 1944.67 ml    Exam:  General: Pleasant alert oriented Caucasian female in no apparent distress Cardiovascular: S1-S2 tachycardic irregularly irregular soft murmur left sternal edge, RRR Respiratory: Clinically clear no added sound Abdomen: Soft nontender nondistended no rebound or guarding Skin no lower stroke edema Neuro motor grossly intact  Data Reviewed: Basic Metabolic Panel:  Lab 11/02/11 1308 11/01/11 0509 10/31/11 0805  NA 138 138 138  K 3.9 4.3 --  CL 106 109 107  CO2 24 24 23   GLUCOSE 101* 98 103*  BUN 13 19 26*  CREATININE 0.82 0.87 0.99  CALCIUM 8.3* 8.4 8.6  MG -- -- --  PHOS -- -- --   Liver Function Tests:  Lab 11/02/11 0636 10/31/11 0805  AST 18 18  ALT 12 18  ALKPHOS 58 65  BILITOT 1.0 0.5  PROT 5.3* 5.9*  ALBUMIN 2.9* 3.2*   No results found for this basename: LIPASE:5,AMYLASE:5 in the last 168 hours No results found for this basename: AMMONIA:5 in the last 168 hours CBC:  Lab 11/03/11 0605 11/02/11 2218 11/02/11 1411 11/02/11 0636 11/01/11 2230 10/31/11 0805  WBC 8.3 9.0 9.2 8.9 8.6 --  NEUTROABS -- -- -- -- -- 9.4*  HGB 10.7* 10.4* 10.4* 10.3* 10.2* --  HCT 31.3* 29.8* 30.2* 29.6* 29.1* --  MCV 89.9 89.0 90.1 90.0 89.0 --  PLT 125* 114* 115* 101* 114* --   Cardiac  Enzymes: No results found for this basename: CKTOTAL:5,CKMB:5,CKMBINDEX:5,TROPONINI:5 in the last 168 hours BNP: No components found with this basename: POCBNP:5 CBG:  Lab 10/31/11 1655  GLUCAP 128*    Recent Results (from the past 240 hour(s))  MRSA PCR SCREENING     Status: Normal   Collection Time   10/31/11  5:37 PM      Component Value Range Status Comment   MRSA by PCR NEGATIVE  NEGATIVE Final      Studies:              All Imaging reviewed and is as per above notation   Scheduled Meds:     . diltiazem  240 mg Oral Daily  . docusate sodium  100 mg Oral Daily  . feeding supplement  1 Container Oral QAC supper  . metoprolol tartrate  50 mg Oral BID  . pantoprazole (PROTONIX) IV  40 mg Intravenous Q24H  . sodium chloride  3 mL Intravenous Q12H  . DISCONTD: diltiazem  60 mg Oral Q4H   Continuous Infusions:    . DISCONTD: sodium chloride 100 mL/hr at 11/03/11 0851     Assessment/Plan: 1. Hypovolemic shock-secondary to #2 see below 2. Melena/hematochezia = status post 5 units packed red blood cells, 3 units FFP.  Hemodynamically stable currently-with hemoglobin stabilizing at 10.3-will recheck tomorrow am.  I have graduated to a soft diet-potentially to full tomorrow am 3. Rapid A. fib with RVR = diltiazem drip discontinued 11/01/2011 p.m. Appreciate cardiology assistance-NOw on Metoprolol 50 bid, Cardizem 240 cd  Her INR is 1.2.  Unclear whether patient will have her be able to take anticoagulation again-needs to be addressed with primary care physician and primary cardiologist given she has elevated [place] score and #6-Spoke with SDr. Vernie Ammons, Urologist who recommended ECHO to rule out specific precipitants such as intramural clot.  ECHO neg. 4. Anemia blood loss acute-per #1 threshold for transfusion would be below 8. 5. Hypertension with relative hypotensive episodes-multifactorial-likely secondary to the + Dilaudid, + Cardizem and Metoprolol-might need to dose adjust 6. Coronary artery disease/NSTEMI with stent 03/2010 = stable, app cards input. 7. History right renal artery embolus-will need some form anticoagulation-see above.  See #2-NO anticoag unless cleared by GI 8. History severe pulmonary hypertension, PA peak 67 mmHg 04/02/2010-monitored closely-atrial fibrillation could be related to this. 9. TCP-relative and likely 2/2 to overt blood loss.  Hold any heparins.  Recheck am-out-pt follow up  Code Status: DO NOT RESUSCITATE Family Communication: None at bedside-updated  patient personally asked to plan of care-Called to update Mickey, NOK at cell phone-advised and LM to call me back Disposition Plan: Tele-? D/c 1-2 d   Pleas Koch, MD  [facility] Pager [phone] 11/03/2011, 11:43 AM    LOS: 3 days

## 2011-11-03 NOTE — Progress Notes (Signed)
SUBJECTIVE: She is feeling much better this am. No pain or SOB.   BP 170/92  Pulse 70  Temp 97.4 F (36.3 C) (Oral)  Resp 19  Ht 5\' 3"  (1.6 m)  Wt 148 lb 9.4 oz (67.4 kg)  BMI 26.32 kg/m2  SpO2 91%  Intake/Output Summary (Last 24 hours) at 11/03/11 0617 Last data filed at 11/02/11 2300  Gross per 24 hour  Intake   1753 ml  Output    525 ml  Net   1228 ml    PHYSICAL EXAM General: Thin elderly female in no acute distress, Pleasant. Alert and oriented x 3.  Psych:  Good affect, responds appropriately Neck: No JVD. No masses noted.  Lungs: Clear bilaterally with no wheezes or rhonci noted.  Heart: Irregular with no murmurs noted. Abdomen: Bowel sounds are present. Soft, non-tender.  Extremities: No lower extremity edema.   LABS: Basic Metabolic Panel:  Basename 11/02/11 0636 11/01/11 0509  NA 138 138  K 3.9 4.3  CL 106 109  CO2 24 24  GLUCOSE 101* 98  BUN 13 19  CREATININE 0.82 0.87  CALCIUM 8.3* 8.4  MG -- --  PHOS -- --   CBC:  Basename 11/02/11 2218 11/02/11 1411 10/31/11 0805  WBC 9.0 9.2 --  NEUTROABS -- -- 9.4*  HGB 10.4* 10.4* --  HCT 29.8* 30.2* --  MCV 89.0 90.1 --  PLT 114* 115* --    Current Meds:    . diltiazem  60 mg Oral Q4H  . feeding supplement  1 Container Oral QAC supper  . pantoprazole (PROTONIX) IV  40 mg Intravenous Q24H  . sodium chloride  3 mL Intravenous Q12H   Echo 11/02/11:  Left ventricle: The cavity size was normal. Wall thickness was increased in a pattern of mild LVH. Systolic function was vigorous. The estimated ejection fraction was in the range of 65% to 70%. Wall motion was normal; there were no regional wall motion abnormalities. - Aortic valve: There was moderate stenosis. Mild regurgitation. Valve area: 0.89cm^2(VTI). Valve area: 0.86cm^2 (Vmax). - Mitral valve: Calcified annulus. Mild regurgitation. Valve area by continuity equation (using LVOT flow): 1.28cm^2. - Left atrium: The atrium was severely  dilated. - Right atrium: The atrium was moderately dilated. - Tricuspid valve: Severe regurgitation. - Pulmonary arteries: Systolic pressure was severely increased. PA peak pressure: 97mm Hg (S). - Pericardium, extracardiac: A small pericardial effusion was identified. There was a left pleural effusion.    ASSESSMENT AND PLAN: 78 yo female with history of CAD, permanent atrial fibrillation, aortic stenosis, mitral regurgitation, HTN who had been on chronic anti-coagulation and was admitted with GI bleeding, now s/p multiple transfusions. Cardiology consulted for management of atrial fibrillation.   1. Atrial fibrillation: Rate controlled this am on po Cardizem being dosed Q4 hours. Her home meds include Cardizem CD 240 mg po Qdaily and Metoprolol 50 mg po BID. She is no longer hypotensive with stabilization of BI bleeding. Would switch back to her home dose of long acting Cardizem CD 240 mg po Qdaily and add back Metoprolol.  I would not recommend anti-coagulation currently with recent life threatening GI bleeding in this 78 yo female while on Xarelto. She does also have a history of renal artery thrombus in January 2012 while on ASA and Plavix per notes of Dr. Myrtis Ser. As her risk of life threatening bleeding on anti-coagulation currently outweighs risk of future thrombotic events, would not recommend restarting anti-coagulation as an inpatient. This can be readdressed  at f/u with  Dr. Myrtis Ser, her primary cardiologist.   2. CAD: She has a history of CAD with bare metal stents placed January 2012. She has not been maintained on Plavix long term as an outpatient but has been on ASA/coumadin. I would recommend restarting ASA 81mg  po Qdaily when safe from a GI standpoint. Resume lipitor and beta blocker. No signs of ischemia with recent severe metabolic stress with anemia and hypotension. No further ischemic workup.   3. Valvular heart disease: Moderate AS, Severe TR, mild MR. Will follow.   4. Severe  pulmonary HTN  5. Anemia: H/H stable. GI consult team following. Bleeding scan negative. Felt to be diverticular bleed but no plans for colonoscopy given advanced age. On PPI.     MCALHANY,CHRISTOPHER  8/9/20136:17 AM

## 2011-11-03 NOTE — Evaluation (Signed)
Physical Therapy Evaluation Patient Details Name: [name] MRN: [id number] DOB: 1919-01-15 Today's Date: 11/03/2011 Time: 4401-0272 PT Time Calculation (min): 11 min  PT Assessment / Plan / Recommendation Clinical Impression  Pt admitted for hematochezia.  Pt would benefit from acute PT services in order to improve independence with transfers and ambulation to prepare for d/c home with caregiver 24/7 assist.  Pt and caregiver in room report no falls at home. Pt declines HHPT at this time but agreeable to therapy while in hospital.    PT Assessment  Patient needs continued PT services    Follow Up Recommendations  No PT follow up    Barriers to Discharge        Equipment Recommendations  None recommended by PT    Recommendations for Other Services     Frequency Min 3X/week    Precautions / Restrictions     Pertinent Vitals/Pain No pain     Mobility  Bed Mobility Bed Mobility: Supine to Sit Supine to Sit: 6: Modified independent (Device/Increase time) Transfers Transfers: Sit to Stand;Stand to Sit Sit to Stand: 4: Min guard;From bed;With upper extremity assist Stand to Sit: 4: Min guard;To chair/3-in-1;With upper extremity assist Details for Transfer Assistance: verbal cues for technique Ambulation/Gait Ambulation/Gait Assistance: 4: Min guard Ambulation Distance (Feet): 120 Feet Assistive device: Other (Comment) (2 hands on IV pole) Ambulation/Gait Assistance Details: pt states she dislikes RW, used both hands to push IV pole, reports legs feeling weak Gait Pattern: Step-through pattern;Decreased stride length Gait velocity: decreased    Exercises     PT Diagnosis: Difficulty walking  PT Problem List: Decreased activity tolerance;Decreased mobility PT Treatment Interventions: DME instruction;Gait training;Functional mobility training;Therapeutic activities;Therapeutic exercise;Patient/family education   PT Goals Acute Rehab PT Goals PT Goal Formulation: With  patient Time For Goal Achievement: 11/10/11 Potential to Achieve Goals: Good Pt will go Sit to Stand: with modified independence PT Goal: Sit to Stand - Progress: Goal set today Pt will go Stand to Sit: with modified independence PT Goal: Stand to Sit - Progress: Goal set today Pt will Ambulate: >150 feet;with modified independence;with least restrictive assistive device PT Goal: Ambulate - Progress: Goal set today Pt will Perform Home Exercise Program: with supervision, verbal cues required/provided PT Goal: Perform Home Exercise Program - Progress: Goal set today  Visit Information  Last PT Received On: 11/03/11 Assistance Needed: +1    Subjective Data  Subjective: I haven't walked since Sunday.   Prior Functioning  Home Living Lives With: Alone Available Help at Discharge: Available 24 hours/day;Personal care attendant Type of Home: House Home Access: Level entry Home Layout: One level Home Adaptive Equipment: Bedside commode/3-in-1;Shower chair with back;Straight cane;Wheelchair - manual Prior Function Level of Independence: Independent with assistive device(s) Comments: Pt reports using cane or walking stick for mobility. Communication Communication: No difficulties    Cognition  Overall Cognitive Status: Appears within functional limits for tasks assessed/performed Arousal/Alertness: Awake/alert Orientation Level: Appears intact for tasks assessed Behavior During Session: [facility] for tasks performed    Extremity/Trunk Assessment Right Upper Extremity Assessment RUE ROM/Strength/Tone: [facility] for tasks assessed Left Upper Extremity Assessment LUE ROM/Strength/Tone: [facility] for tasks assessed Right Lower Extremity Assessment RLE ROM/Strength/Tone: [facility] for tasks assessed Left Lower Extremity Assessment LLE ROM/Strength/Tone: [facility] for tasks assessed   Balance    End of Session PT - End of Session Activity Tolerance: Patient tolerated treatment well Patient left: in chair;with  call bell/phone within reach;with family/visitor present  GP     Lanika Colgate,KATHrine E  11/03/2011, 11:30 AM Pager: [phone]

## 2011-11-04 DIAGNOSIS — I4891 Unspecified atrial fibrillation: Secondary | ICD-10-CM

## 2011-11-04 DIAGNOSIS — K921 Melena: Secondary | ICD-10-CM

## 2011-11-04 LAB — CBC
HCT: 28.8 % — ABNORMAL LOW (ref 36.0–46.0)
HCT: 31.5 % — ABNORMAL LOW (ref 36.0–46.0)
Hemoglobin: 10 g/dL — ABNORMAL LOW (ref 12.0–15.0)
MCH: 31.3 pg (ref 26.0–34.0)
MCH: 31.6 pg (ref 26.0–34.0)
MCHC: 34.3 g/dL (ref 30.0–36.0)
MCV: 90.3 fL (ref 78.0–100.0)
MCV: 92.1 fL (ref 78.0–100.0)
RBC: 3.19 MIL/uL — ABNORMAL LOW (ref 3.87–5.11)
RDW: 16.8 % — ABNORMAL HIGH (ref 11.5–15.5)
WBC: 7.8 10*3/uL (ref 4.0–10.5)

## 2011-11-04 MED ORDER — ACETAMINOPHEN 500 MG PO TABS: 500.0000 mg | ORAL_TABLET | Freq: Four times a day (QID) | ORAL | Status: AC | PRN

## 2011-11-04 NOTE — Progress Notes (Signed)
Stable Hgb 10.0, she will see her PCP Dr Marina Goodell in 2 weeks and will have a repeat CBC, she has an appointment with Dr Myrtis Ser at the end of September 2013 and will stay off anticoagulants till then. ? Home today

## 2011-11-04 NOTE — Progress Notes (Signed)
Subjective:  In bed comfortable. Main complaint is chronic back pain. She says she sees Dr. Alda Berthold was given her an injection in the past. She's not feeling any shortness of breath, chest pain, dizziness.  Objective:  Vital Signs in the last 24 hours: Temp:  [98.2 F (36.8 C)-98.6 F (37 C)] 98.2 F (36.8 C) (08/10 0640) Pulse Rate:  [59-132] 132  (08/10 0643) Resp:  [20-24] 20  (08/10 0640) BP: (113-175)/(77-100) 168/94 mmHg (08/10 0643) SpO2:  [93 %-95 %] 93 % (08/10 0640) Weight:  [66.2 kg (145 lb 15.1 oz)] 66.2 kg (145 lb 15.1 oz) (08/10 0643)  Intake/Output from previous day: 08/09 0701 - 08/10 0700 In: 723 [P.O.:720; I.V.:3] Out: 1000 [Urine:1000]   Physical Exam: General: Well developed, elderly female, looks younger than stated age, in no acute distress. Head:  Normocephalic and atraumatic. Lungs: Clear to auscultation and percussion. Heart: Irregularly irregular rhythm, normal rate, 2/6 systolic murmur right upper sternal border.  Abdomen: soft, non-tender, positive bowel sounds. Extremities: No clubbing or cyanosis. No edema. Neurologic: Alert and oriented x 3.    Lab Results:  Basename 11/04/11 0710 11/03/11 2230  WBC 7.8 8.1  HGB 10.0* 10.1*  PLT 113* 121*    Basename 11/02/11 0636  NA 138  K 3.9  CL 106  CO2 24  GLUCOSE 101*  BUN 13  CREATININE 0.82  l  Basename 11/02/11 0636  PROT 5.3*  ALBUMIN 2.9*  AST 18  ALT 12  ALKPHOS 58  BILITOT 1.0  BILIDIR --  IBILI --   Telemetry: Atrial fibrillation well rate controlled mostly below 100. Personally viewed.   Assessment/Plan:  Principal Problem:  *Hematochezia Active Problems:  A-fib  CAD (coronary artery disease)  Aortic stenosis, moderate  HTN (hypertension)  Renal insufficiency  Anemia  Diverticulosis  GIB (gastrointestinal bleeding)  Coagulopathy  1. Atrial fibrillation-currently well rate controlled on current medical therapy. She had rapid ventricular response in the setting of  catastrophic GI bleed. Continue with current dosing of Cardizem CD 240 mg a day as well as metoprolol 50 mg twice a day. Doing well. Telemetry reviewed.  2. Chronic anticoagulation-I did discuss with her that she is at increased risk for stroke without anticoagulation however the risk of anticoagulation outweigh the benefits at this point given her catastrophic GI bleed. I would not restart anticoagulation at this time. Dr. Myrtis Ser may wish to revisit this as an outpatient.  3. Coronary artery disease-status post circumflex PCI. Prior non-ST elevation myocardial infarction in 2012. Overall she is not complaining of any angina. Doing well. She is off of aspirin. He would not be unreasonable to restart aspirin when GI feels as though this is comfortable given her underlying coronary artery disease. Stent was a bare-metal stent.  4. Aortic stenosis-murmur appreciated on exam. Moderate in severity.  We will continue to follow.  SKAINS, MARK 11/04/2011, 7:50 AM

## 2011-11-04 NOTE — Discharge Summary (Signed)
Physician Discharge Summary  [name] WUJ:[id number] DOB: 1918-06-03 DOA: 10/31/2011  PCP: Abigail Miyamoto, MD  Admit date: 10/31/2011 Discharge date: 11/04/2011  Recommendations for Outpatient Follow-up:  1. Needs CBC in 3-4 days  2. Needs Cardiology input in 1-2 weeks 3. GI appt already made  Discharge Diagnoses:  Principal Problem:  *Hematochezia Active Problems:  A-fib  CAD (coronary artery disease)  Aortic stenosis, moderate  HTN (hypertension)  Renal insufficiency  Anemia  Diverticulosis  GIB (gastrointestinal bleeding)  Coagulopathy   Discharge Condition: Good  Diet recommendation: soft, low residue  Filed Weights   11/03/11 0500 11/04/11 0500 11/04/11 0643  Weight: 67.4 kg (148 lb 9.4 oz) 66.2 kg (145 lb 15.1 oz) 66.2 kg (145 lb 15.1 oz)    History of present illness:  78 yr old lady with multiple Cardiac issues admit 8/6 with likely diverticular bleed to tele, Tx to SDU, received 5 units total packed red blood cells, 3 units of FFP and received a bleeding scan which was non-revealing for any acute bleed. Cardiology and critical care were consulted regarding recommendations for #1 atrial fib #2 potential physiologic decompensation and patient did not decompensate and physiologically stabilized.   Hospital Course:  1. Hypovolemic shock-secondary to #2 see below 2. Melena/hematochezia = status post 5 units packed red blood cells, 3 units FFP over course of hospitalization.  Hemodynamically stable currently-with hemoglobin stabilizing at 10.3-will recheck tomorrow am. Graduated to a soft diet.  She was cleared from the standpoint of GI and has been encouraged to follow-up with GI. 3. Rapid A. fib with RVR = diltiazem drip discontinued 11/01/2011 p.m.  Appreciate cardiology assistance-Now on Metoprolol 50 bid, Cardizem 240 cd.  Her INR is 1.2. Unclear whether patient will have her be able to take anticoagulation again-needs to be addressed with primary care  physician and primary cardiologist given she has elevated [place] score and #6-Spoke with Dr. Vernie Ammons, Urologist who recommended ECHO to rule out specific precipitants such as intramural clot. ECHO neg. 4. Anemia blood loss acute-per #1 threshold for transfusion would be below 8. 5. Hypertension with relative hypotensive episodes-multifactorial-likely secondary to the + Dilaudid, + Cardizem and Metoprolol-Cardiology input appreciated re: dose adjustments 6. Coronary artery disease/NSTEMI with stent 03/2010 = stable, app cards input. 7. History right renal artery embolus-will need some form anticoagulation-see above. See #2-NO anticoag unless cleared by GI 8. History severe pulmonary hypertension, PA peak 67 mmHg 04/02/2010-monitored closely-atrial fibrillation could be related to this. 9. TCP-relative and likely 2/2 to overt blood loss. Hold any heparins. Has been stable but will need to be followed up as an out-patient  Past medical history-As per Problem list  Chart reviewed as below-  NSTEMI 04/01/10 admit s/p PCI BMS to Ramus intermediau and prox L circ  A fib Diag 1/6/132 admit  Kept on Plavix initially, Changed to coumadin  H/o Severe Pulm HTN on echo  Mitral/aortic stenosis/insuff  R renal atery thrombus, ? Embolic on 04/23/10 admit  H/o Diverticulosis  Noted low density L upper Kidney calcification? 1/12  ?Cirrhosis liver?  Cholcystitis-Chronic-Sees Dr. Donell Beers   Consultants:  GI-Dr. Juanda Chance  Cards-Stuckey/[place]  Critical care-[place] Telephone consulted Dr. Vernie Ammons re: Renal Artery thrombus  Procedures:  Bleeding scan 11/01/2011 = no scintigraphic evidence for active gastrointestinal hemorrhage  Antibiotics:  none  Discharge Exam: Filed Vitals:   11/04/11 1321  BP: 135/82  Pulse: 80  Temp: 98.4 F (36.9 C)  Resp: 20   Filed Vitals:   11/04/11 9562 11/04/11 1308 11/04/11 6578  11/04/11 1321  BP: 160/97 175/100 168/94 135/82  Pulse: 94 89 132 80  Temp: 98.2 F (36.8 C)    98.4 F (36.9 C)  TempSrc: Oral   Oral  Resp: 20   20  Height:      Weight:   66.2 kg (145 lb 15.1 oz)   SpO2: 93%   95%   General: Pleasant alert oriented Caucasian female in no apparent distress  Cardiovascular: S1-S2 tachycardic irregularly irregular soft murmur left sternal edge, RRR  Respiratory: Clinically clear no added sound  Abdomen: Soft nontender nondistended no rebound or guarding  Skin no lower stroke edema  Neuro motor grossly intact  Discharge Instructions  Discharge Orders    Future Appointments: Provider: Department: Dept Phone: Center:   11/15/2011 3:00 PM Beverley Fiedler, MD [facility] [place] [facility] [phone] [facility]   12/22/2011 2:15 PM Luis Abed, MD [facility] [phone] [facility]     Medication List  As of 11/04/2011  2:57 PM   STOP taking these medications         aspirin 81 MG tablet      nitroGLYCERIN 0.4 MG SL tablet      potassium chloride 10 MEQ CR tablet      XARELTO 20 MG Tabs         TAKE these medications         acetaminophen 500 MG tablet   Commonly known as: TYLENOL   Take 1 tablet (500 mg total) by mouth every 6 (six) hours as needed for pain.      ALPRAZolam 0.5 MG tablet   Commonly known as: XANAX   Take 0.5 mg by mouth 3 (three) times daily as needed.      AMBIEN 5 MG tablet   Generic drug: zolpidem   Take 5 mg by mouth at bedtime as needed.      atorvastatin 10 MG tablet   Commonly known as: LIPITOR   Take 10 mg by mouth daily.      diltiazem 240 MG 24 hr capsule   Commonly known as: CARDIZEM CD      metoprolol 50 MG tablet   Commonly known as: LOPRESSOR   Take 50 mg by mouth 2 (two) times daily.      oxyCODONE-acetaminophen 5-325 MG per tablet   Commonly known as: PERCOCET/ROXICET   Take 1 tablet by mouth every 4 (four) hours as needed. pain           Follow-up Information    Follow up with Abigail Miyamoto, MD in 1 week.   Contact information:   [address] [place]  [place] [address] [phone]       Follow up with Yancey Flemings, MD in 2 weeks.   Contact information:   [address] [facility] [address] [place] [address] [place] [address] [phone]           The results of significant diagnostics from this hospitalization (including imaging, microbiology, ancillary and laboratory) are listed below for reference.    Significant Diagnostic Studies: Nm Gi Blood Loss  11/01/2011  *RADIOLOGY REPORT*  Clinical Data: Large volume hematochezia.  Anemia.  NUCLEAR MEDICINE GASTROINTESTINAL BLEEDING STUDY  Technique:  Sequential abdominal images were obtained following intravenous administration of Tc-16m labeled red blood cells.  Radiopharmaceutical: CURIE 58mTc04 SODIUM PERTECHNETATE TC 49M INJECTION  Comparison: None.  Findings: Normal blood pool activity is evident.  No findings of active ongoing gastrointestinal blood loss after 2 hours  of observation.  IMPRESSION: No scintigraphic evidence for active gastrointestinal hemorrhage.  Original Report Authenticated By: ERIC A. MANSELL, M.D.   Ct Abdomen Pelvis W Contrast  10/19/2011  *RADIOLOGY REPORT*  Clinical Data: Abdominal pain, constipation.  CT ABDOMEN AND PELVIS WITH CONTRAST  Technique:  Multidetector CT imaging of the abdomen and pelvis was performed following the standard protocol during bolus administration of intravenous contrast.  Contrast: 80mL OMNIPAQUE IOHEXOL 300 MG/ML  SOLN  Comparison: Ultrasound 10/16/2011  Findings: Lung bases are clear.  No pericardial fluid.  There is significant valvular calcification noted.  No focal hepatic lesion.  Gallbladder, pancreas, spleen, adrenal glands, and kidneys are normal.  There is a large calculus mid pole of the left kidney measuring 10 mm.  No evidence of obstruction. There is a low density 11 mm lesion in the mid left kidney which cannot be fully characterized on this scan but was demonstrated to be a simple cyst on comparison MRI.  There is  bilateral renal cortical thinning.  The stomach, small bowel, colon are normal.  There are diverticula of the sigmoid colon.  There is moderate volume stool throughout the colon.  No evidence of obstruction.  Abdominal aorta normal caliber.  No retroperitoneal periportal lymphadenopathy.  No free fluid the pelvis.  The bladder is normal.  Post hysterectomy anatomy.  No pelvic lymphadenopathy.  The unilateral pars defect on the left with grade 1 anterolisthesis of L4-L5.  IMPRESSION:  1.  No acute abdominal or pelvic findings. 2.  Nonobstructing left renal calculus. 3.  Bilateral renal cortical thinning. 4.  Mitral valvular calcification. 5.  Sigmoid diverticulosis.  6.  Moderate volume stool burden throughout the colon. 7.  Unilateral pars defect at  L4 with grade 1 anterolisthesis.  Original Report Authenticated By: Genevive Bi, M.D.    Microbiology: Recent Results (from the past 240 hour(s))  MRSA PCR SCREENING     Status: Normal   Collection Time   10/31/11  5:37 PM      Component Value Range Status Comment   MRSA by PCR NEGATIVE  NEGATIVE Final      Labs: Basic Metabolic Panel:  Lab 11/02/11 9147 11/01/11 0509 10/31/11 0805  NA 138 138 138  K 3.9 4.3 4.5  CL 106 109 107  CO2 24 24 23   GLUCOSE 101* 98 103*  BUN 13 19 26*  CREATININE 0.82 0.87 0.99  CALCIUM 8.3* 8.4 8.6  MG -- -- --  PHOS -- -- --   Liver Function Tests:  Lab 11/02/11 0636 10/31/11 0805  AST 18 18  ALT 12 18  ALKPHOS 58 65  BILITOT 1.0 0.5  PROT 5.3* 5.9*  ALBUMIN 2.9* 3.2*   No results found for this basename: LIPASE:5,AMYLASE:5 in the last 168 hours No results found for this basename: AMMONIA:5 in the last 168 hours CBC:  Lab 11/04/11 0710 11/03/11 2230 11/03/11 1359 11/03/11 0605 11/02/11 2218 10/31/11 0805  WBC 7.8 8.1 8.4 8.3 9.0 --  NEUTROABS -- -- -- -- -- 9.4*  HGB 10.0* 10.1* 9.8* 10.7* 10.4* --  HCT 28.8* 29.7* 28.2* 31.3* 29.8* --  MCV 90.3 91.4 90.7 89.9 89.0 --  PLT 113* 121* 113* 125*  114* --   Cardiac Enzymes: No results found for this basename: CKTOTAL:5,CKMB:5,CKMBINDEX:5,TROPONINI:5 in the last 168 hours BNP: BNP (last 3 results) No results found for this basename: PROBNP:3 in the last 8760 hours CBG:  Lab 10/31/11 1655  GLUCAP 128*    Time coordinating discharge: 25 minutes  SignedRhetta Mura  [facility] 11/04/2011, 2:33 PM

## 2011-11-14 ENCOUNTER — Encounter: Payer: Self-pay | Admitting: Internal Medicine

## 2011-11-15 ENCOUNTER — Encounter: Payer: Self-pay | Admitting: Internal Medicine

## 2011-11-15 ENCOUNTER — Other Ambulatory Visit (INDEPENDENT_AMBULATORY_CARE_PROVIDER_SITE_OTHER): Payer: Medicare Other

## 2011-11-15 ENCOUNTER — Ambulatory Visit (INDEPENDENT_AMBULATORY_CARE_PROVIDER_SITE_OTHER): Payer: Medicare Other | Admitting: Internal Medicine

## 2011-11-15 VITALS — BP 136/72 | HR 76 | Ht 63.0 in | Wt 139.2 lb

## 2011-11-15 DIAGNOSIS — K5900 Constipation, unspecified: Secondary | ICD-10-CM

## 2011-11-15 DIAGNOSIS — R110 Nausea: Secondary | ICD-10-CM

## 2011-11-15 DIAGNOSIS — K922 Gastrointestinal hemorrhage, unspecified: Secondary | ICD-10-CM

## 2011-11-15 DIAGNOSIS — R630 Anorexia: Secondary | ICD-10-CM

## 2011-11-15 LAB — CBC WITH DIFFERENTIAL/PLATELET
Basophils Absolute: 0 10*3/uL (ref 0.0–0.1)
Eosinophils Absolute: 0.4 10*3/uL (ref 0.0–0.7)
Lymphocytes Relative: 10.4 % — ABNORMAL LOW (ref 12.0–46.0)
MCHC: 32.8 g/dL (ref 30.0–36.0)
Monocytes Absolute: 0.5 10*3/uL (ref 0.1–1.0)
Neutrophils Relative %: 77.7 % — ABNORMAL HIGH (ref 43.0–77.0)
RDW: 19 % — ABNORMAL HIGH (ref 11.5–14.6)

## 2011-11-15 MED ORDER — PANTOPRAZOLE SODIUM 40 MG PO TBEC: 40.0000 mg | DELAYED_RELEASE_TABLET | Freq: Every day | ORAL | Status: DC

## 2011-11-15 NOTE — Progress Notes (Signed)
Subjective:    Patient ID: [name], female    DOB: February 11, 1919, 78 y.o.   MRN: [id number]  HPI [name] is a 78 yo female with PMH of CAD, aortic stenosis, mitral regurg, hypertension, renal artery thrombosis, atrial fibrillation who is seen in follow-up.  She was hospitalized for 4-5 days earlier this month with presumed diverticular hemorrhage. She presented with painless hematochezia and drop in her hemoglobin, ultimately requiring 5 units of packed red blood cells. During this hospitalization her anticoagulation was held, and at this point it has not been resumed. She is pending follow with her cardiologist.  She's had no further bleeding and no melena. She does continue to have some fatigue. Her constipation has improved with MiraLAX therapy, and she continue 17 g daily. She did stop the Dulcolax.  She reports some issues with nausea, and has been given a prescription for Zofran which helps. Her appetite also is decreased, which concerns her health care aide.  Her healthcare aide also reports that she thinks the patient has a significant amount of anxiety over her overall medical condition and seems to perseverate on small details.intermittently she also sleeps poorly, and is more tired on the following days.she's had no fevers or chills. No vomiting. Denies heartburn. No dysphagia.  Review of Systems  as per history of present illness, otherwise negative   Current Medications, Allergies, Past Medical History, Past Surgical History, Family History and Social History were reviewed in [employer] record.     Objective:   Physical Exam BP 136/72  Pulse 76  Ht 5\' 3"  (1.6 m)  Wt 139 lb 3.2 oz (63.141 kg)  BMI 24.66 kg/m2 Constitutional: Well-developed and well-nourished, elderly appearing in NAD  HEENT: Normocephalic and atraumatic. Oropharynx is clear and moist. No oropharyngeal exudate. Conjunctivae are normal. No scleral icterus.  Neck: Neck supple. Trachea  midline.  Cardiovascular: Normal rate, regular rhythm and intact distal pulses. 2/6 SEM  Pulmonary/chest: Effort normal and breath sounds normal. No wheezing, rales or rhonchi.  Abdominal: Soft, nontender, nondistended. Bowel sounds active throughout. There are no masses palpable. No hepatosplenomegaly.  Extremities: Trace edema left lower extremity, no clubbing or cyanosis  Lymphadenopathy: No cervical adenopathy noted.  Neurological: Alert and oriented to person place and time.  Skin: Skin is warm and dry. No rashes noted.  Psychiatric: Normal mood and affect. Behavior is normal.  CBC    Component Value Date/Time   WBC 7.9 11/15/2011 1454   RBC 3.56* 11/15/2011 1454   HGB 11.2* 11/15/2011 1454   HCT 34.2* 11/15/2011 1454   PLT 162.0 11/15/2011 1454   MCV 96.2 11/15/2011 1454   MCH 31.6 11/04/2011 1455   MCHC 32.8 11/15/2011 1454   RDW 19.0* 11/15/2011 1454   LYMPHSABS 0.8 11/15/2011 1454   MONOABS 0.5 11/15/2011 1454   EOSABS 0.4 11/15/2011 1454   BASOSABS 0.0 11/15/2011 1454   CMP     Component Value Date/Time   NA 138 11/02/2011 0636   K 3.9 11/02/2011 0636   CL 106 11/02/2011 0636   CO2 24 11/02/2011 0636   GLUCOSE 101* 11/02/2011 0636   BUN 13 11/02/2011 0636   CREATININE 0.82 11/02/2011 0636   CALCIUM 8.3* 11/02/2011 0636   PROT 5.3* 11/02/2011 0636   ALBUMIN 2.9* 11/02/2011 0636   AST 18 11/02/2011 0636   ALT 12 11/02/2011 0636   ALKPHOS 58 11/02/2011 0636   BILITOT 1.0 11/02/2011 0636   GFRNONAA 61* 11/02/2011 0636   GFRAA 70* 11/02/2011  5784   CT ABDOMEN AND PELVIS WITH CONTRAST -- 10/19/2011   Technique:  Multidetector CT imaging of the abdomen and pelvis was performed following the standard protocol during bolus administration of intravenous contrast.   Contrast: 80mL OMNIPAQUE IOHEXOL 300 MG/ML  SOLN   Comparison: Ultrasound 10/16/2011   Findings: Lung bases are clear.  No pericardial fluid.  There is significant valvular calcification noted.   No focal hepatic lesion.  Gallbladder,  pancreas, spleen, adrenal glands, and kidneys are normal.  There is a large calculus mid pole of the left kidney measuring 10 mm.  No evidence of obstruction. There is a low density 11 mm lesion in the mid left kidney which cannot be fully characterized on this scan but was demonstrated to be a simple cyst on comparison MRI.  There is bilateral renal cortical thinning.   The stomach, small bowel, colon are normal.  There are diverticula of the sigmoid colon.  There is moderate volume stool throughout the colon.  No evidence of obstruction.   Abdominal aorta normal caliber.  No retroperitoneal periportal lymphadenopathy.   No free fluid the pelvis.  The bladder is normal.  Post hysterectomy anatomy.  No pelvic lymphadenopathy.   The unilateral pars defect on the left with grade 1 anterolisthesis of L4-L5.   IMPRESSION:   1.  No acute abdominal or pelvic findings. 2.  Nonobstructing left renal calculus. 3.  Bilateral renal cortical thinning. 4.  Mitral valvular calcification. 5.  Sigmoid diverticulosis.   6.  Moderate volume stool burden throughout the colon. 7.  Unilateral pars defect at  L4 with grade 1 anterolisthesis.     Assessment & Plan:   78 yo female with PMH of CAD, aortic stenosis, mitral regurg, hypertension, renal artery thrombosis, atrial fibrillation who is seen in follow-up.  1. LGI bleeding -- the patient was hospitalized for painless hematochezia requiring blood transfusion. No intervention was performed and the bleeding resolved and has not returned. We had check CBC today and her hemoglobin is stable, in fact up slightly.  The presumed diagnosis was diverticular hemorrhage, and in fact she did have diverticulosis on her recent CT scan. At this point given her age and medical comorbidities we discussed and decided not to pursue colonoscopy. The patient is agreeable with this plan. She remains off anticoagulation and will be meeting with her cardiologist to discuss  this further next week.  2. Constipation -- she's had a good response to MiraLAX therapy and I have recommended she continue MiraLAX 17 g daily. This seems to be working better for her than Dulcolax and is not associated with lower abdominal cramping or pain. We discussed how this is a reasonable laxative for her going forward and indefinitely if necessary  3. Nausea/poor appetite -- she was previously on PPI and this was discontinued, I'm uncertain if this has led to some mild dyspepsia. I will give her a trial of pantoprazole 40 mg daily to see if this helps with her nausea. She can continue to use Zofran as directed and as needed. We discussed further evaluation of her upper symptoms with the EGD, but again this is a sedated exam and carries risks. At this point she would prefer a non-procedural approach. We discussed upper GI series as an other option, but again she would like to defer this. We can discuss it again in followup. I will see her in 3 months or sooner if needed

## 2011-11-15 NOTE — Patient Instructions (Addendum)
We have sent the following medications to your pharmacy for you to pick up at your convenience: Protonix, Please take as directed.  Continue taking Miralax  Your physician has requested that you go to the basement for the following lab work before leaving today: CBC  Follow up with Dr. Rhea Belton in 6 months  Follow up with your PCP about your Anxiety and Sleeping issues

## 2011-11-21 ENCOUNTER — Ambulatory Visit (INDEPENDENT_AMBULATORY_CARE_PROVIDER_SITE_OTHER): Payer: Medicare Other | Admitting: Physician Assistant

## 2011-11-21 ENCOUNTER — Encounter: Payer: Self-pay | Admitting: Physician Assistant

## 2011-11-21 VITALS — BP 160/97 | HR 88 | Resp 18 | Ht 62.0 in | Wt 138.8 lb

## 2011-11-21 DIAGNOSIS — I359 Nonrheumatic aortic valve disorder, unspecified: Secondary | ICD-10-CM

## 2011-11-21 DIAGNOSIS — I2510 Atherosclerotic heart disease of native coronary artery without angina pectoris: Secondary | ICD-10-CM

## 2011-11-21 DIAGNOSIS — K922 Gastrointestinal hemorrhage, unspecified: Secondary | ICD-10-CM

## 2011-11-21 DIAGNOSIS — I10 Essential (primary) hypertension: Secondary | ICD-10-CM

## 2011-11-21 DIAGNOSIS — I4891 Unspecified atrial fibrillation: Secondary | ICD-10-CM

## 2011-11-21 DIAGNOSIS — I350 Nonrheumatic aortic (valve) stenosis: Secondary | ICD-10-CM

## 2011-11-21 NOTE — Progress Notes (Signed)
[address] [place], [place]  [address] Phone: [phone] Fax:  [phone]  Date:  11/21/2011   Name:  [name]   DOB:  1918/04/11   MRN:  [id number]  PCP:  Abigail Miyamoto, MD  Primary Cardiologist:  Dr. Zackery Barefoot  Primary Electrophysiologist:  None    History of Present Illness: [name] is a 78 y.o. female who returns for for post hospital follow up.  She has a hx of permanent atrial fibrillation, CAD (NSTEMI 03/2010 s/p BMS to ramus and LCx; 80% small D2 lesion to be followed), moderate AS, mild AI/MR, pulmonary HTN, renal artery thrombosis, CKD (stage 4, Cr <1.00) and hypertension. She was admitted 8/6-8/10 with LGI bleed (likely a diverticular bleed) complicated by hypovolemic shock. She was resuscitated with 5 units of PRBCs and 3 units of FFP. She developed atrial fibrillation with RVR. Her rate controlling medications were adjusted. Xarelto was discontinued. Cardiology recommended avoiding Coumadin and novel anticoagulants in the future as her risk of life-threatening bleed outweighed her risk of recurrent thrombosis. She did followup with gastroenterology last week. There are no further plans for colonoscopy. At discharge, it was recommended she restart aspirin 81 mg daily when felt to be safe from a gastrointestinal. Echocardiogram 10/31/11: Mild LVH, EF 65-70%, moderate AS, mild AI, mean gradient 31, MAC, mild MR, severe LAE, moderate or severe TR, PASP 97.  Her main complaint is that of significant back pain and insomnia. She denies chest pain, significant dyspnea, orthopnea, PND or pedal edema. She denies syncope. She denies any further bleeding. Hemoglobin last week at her gastroenterologist office was 11.2.  Wt Readings from Last 3 Encounters:  11/21/11 138 lb 12.8 oz (62.959 kg)  11/15/11 139 lb 3.2 oz (63.141 kg)  11/04/11 145 lb 15.1 oz (66.2 kg)     Past Medical History  Diagnosis Date  . CAD (coronary artery disease)    Non-STEMI January, 2012.. bare-metal stents to ramus and circumflex, 80% small second diagonal to be followed  /   . Aortic stenosis, moderate     Moderate.. echo.. January 2 012; Echocardiogram 10/31/11: Mild LVH, EF 65-70%, moderate AS, mild AI, mean gradient 31, MAC, mild MR, severe LAE, moderate or severe TR, PASP 97.  . Aortic insufficiency     Mild... echo.. January, 2012  . Mitral regurgitation     EF 60%... echo... January, 2012,  mild MR, severe annular calcification  . HTN (hypertension)   . Diverticulosis   . Pulmonary HTN     67 mmHg.. echo.. January, 2012  . Renal arterial thrombosis     January, 2012 while on aspirin and Plavix for coronary stents ( atrial fibrillation) Plavix stopped and Coumadin started  . Renal insufficiency     Creatinine 1.4.. April 29, 2010  . Anemia     Hemoglobin 9.7, February, 2012  . Cirrhosis of liver     appearance On CT, January, 2012  . Drug therapy     Plavix used for bare-metal stent stopped after several weeks when Coumadin and started for renal artery thrombus  . Atrial fibrillation     New diagnosis January, 2012, Coumadin not used initially because of Plavix and aspirin and age  for stents, renal artery thrombus, Plavix stopped Coumadin started April 23, 2010  . Drug therapy     Coumadin.Marland Kitchen started April 23, 2010 after renal artery embolus (atrial fibrillation)  . Renal mass     Low density, upper pole left  kidney, CT January 2 012, etiology unclear, MRI can be considered  . Renal artery stenosis     Suspected from abdominal CT January, 2012.. significant  . Lower GI bleed     8/13-required 5 units PRBCs and 3 units FFP; anticoagulation discontinued    Current Outpatient Prescriptions  Medication Sig Dispense Refill  . ALPRAZolam (XANAX) 0.5 MG tablet Take 0.5 mg by mouth 3 (three) times daily as needed.        Marland Kitchen atorvastatin (LIPITOR) 10 MG tablet Take 10 mg by mouth daily.      Marland Kitchen diltiazem (CARDIZEM CD) 240 MG 24 hr capsule        . metoprolol (LOPRESSOR) 50 MG tablet Take 50 mg by mouth 2 (two) times daily.      . pantoprazole (PROTONIX) 40 MG tablet Take 1 tablet (40 mg total) by mouth daily.  90 tablet  3  . zolpidem (AMBIEN) 5 MG tablet Take 5 mg by mouth at bedtime as needed.          Allergies: Allergies  Allergen Reactions  . Penicillins   . Sulfa Drugs Cross Reactors   . Sulfonamide Derivatives     History  Substance Use Topics  . Smoking status: Never Smoker   . Smokeless tobacco: Never Used  . Alcohol Use: No     PHYSICAL EXAM: VS:  BP 160/97  Pulse 88  Resp 18  Ht 5\' 2"  (1.575 m)  Wt 138 lb 12.8 oz (62.959 kg)  BMI 25.39 kg/m2  SpO2 97% Well nourished, well developed, in no acute distress HEENT: normal Neck: no JVD Cardiac:  normal S1, S2; RRR; 2/6 systolic murmur heard best at the RUSB  Lungs:  clear to auscultation bilaterally, no wheezing, rhonchi or rales Abd: soft, nontender, no hepatomegaly Ext: no edema Skin: warm and dry Neuro:  CNs 2-12 intact, no focal abnormalities noted  EKG:  Atrial fibrillation, heart rate 77, leftward axis, lateral ST changes, no change when compared to prior tracings      ASSESSMENT AND PLAN:  1. Recent Lower GI Bleed Resolved. She is off of antiplatelet agents as well as vitamin K antagonist/antithrombin inhibitors. Recent hemoglobin improved. Continue followup with gastroenterology.  2. Atrial Fibrillation Rate control. As noted, her risk of life-threatening bleed is much greater than her risk of recurrent thrombosis even in the setting of significant thromboembolic risk factors. Continue off of all anticoagulants for now. Keep followup with Dr. Myrtis Ser next month.  3. Coronary Artery Disease She is currently off of antiplatelet agents. I will be in touch with gastroenterology to see when we can place her back on aspirin 81 mg daily. Keep followup with Dr. Myrtis Ser as noted.  4. Aortic Stenosis Stable. Continue current therapy.  5. Insomnia I  have asked to followup with her PCP.  6. Osteoporosis She has chronic pain. I have asked her to followup with her PCP for further management.  Signed, Tereso Newcomer, PA-C  3:20 PM 11/21/2011

## 2011-11-23 ENCOUNTER — Telehealth: Payer: Self-pay | Admitting: Physician Assistant

## 2011-11-23 DIAGNOSIS — I4891 Unspecified atrial fibrillation: Secondary | ICD-10-CM

## 2011-11-23 NOTE — Telephone Encounter (Signed)
Reviewed with Dr. Rhea Belton (GI). Ok to resume ASA. Have patient start ASA 81 mg QD. Check CBC in 2 weeks and on 9/27 when she sees Dr. Zackery Barefoot. Tereso Newcomer, PA-C  4:51 PM 11/23/2011

## 2011-11-24 MED ORDER — ASPIRIN EC 81 MG PO TBEC: 81.0000 mg | DELAYED_RELEASE_TABLET | Freq: Every day | ORAL | Status: AC

## 2011-11-24 NOTE — Telephone Encounter (Signed)
pt gave me permission to s/w w/her HH aide today about starting ASA 81 QD, cbc 9/13 and 9/27, pt's aide gave me verbal understanding to instructions today 

## 2011-11-24 NOTE — Addendum Note (Signed)
Addended by: Tarri Fuller on: 11/24/2011 01:45 PM   Modules accepted: Orders

## 2011-11-24 NOTE — Telephone Encounter (Signed)
pt gave me permission to s/w w/her [facility] aide today about starting ASA 81 QD, cbc 9/13 and 9/27, pt's aide gave me verbal understanding to instructions today

## 2011-12-01 ENCOUNTER — Other Ambulatory Visit: Payer: Self-pay | Admitting: Cardiology

## 2011-12-08 ENCOUNTER — Other Ambulatory Visit (INDEPENDENT_AMBULATORY_CARE_PROVIDER_SITE_OTHER): Payer: Medicare Other

## 2011-12-08 ENCOUNTER — Other Ambulatory Visit: Payer: Medicare Other

## 2011-12-08 DIAGNOSIS — I4891 Unspecified atrial fibrillation: Secondary | ICD-10-CM

## 2011-12-08 LAB — CBC WITH DIFFERENTIAL/PLATELET
Basophils Absolute: 0 10*3/uL (ref 0.0–0.1)
Hemoglobin: 10.3 g/dL — ABNORMAL LOW (ref 12.0–15.0)
Lymphocytes Relative: 12 % (ref 12.0–46.0)
Monocytes Relative: 6.7 % (ref 3.0–12.0)
Neutro Abs: 5.2 10*3/uL (ref 1.4–7.7)
Neutrophils Relative %: 75.9 % (ref 43.0–77.0)
RBC: 3.19 Mil/uL — ABNORMAL LOW (ref 3.87–5.11)
RDW: 19.2 % — ABNORMAL HIGH (ref 11.5–14.6)

## 2011-12-22 ENCOUNTER — Other Ambulatory Visit: Payer: Medicare Other

## 2011-12-22 ENCOUNTER — Ambulatory Visit: Payer: Medicare Other | Admitting: Cardiology

## 2011-12-29 ENCOUNTER — Other Ambulatory Visit: Payer: Self-pay | Admitting: Cardiology

## 2012-01-09 ENCOUNTER — Encounter: Payer: Self-pay | Admitting: Cardiology

## 2012-01-09 DIAGNOSIS — D689 Coagulation defect, unspecified: Secondary | ICD-10-CM | POA: Insufficient documentation

## 2012-01-09 DIAGNOSIS — T457X1A Poisoning by anticoagulant antagonists, vitamin K and other coagulants, accidental (unintentional), initial encounter: Secondary | ICD-10-CM

## 2012-01-10 ENCOUNTER — Ambulatory Visit (INDEPENDENT_AMBULATORY_CARE_PROVIDER_SITE_OTHER): Payer: Medicare Other | Admitting: Cardiology

## 2012-01-10 ENCOUNTER — Encounter: Payer: Self-pay | Admitting: Cardiology

## 2012-01-10 ENCOUNTER — Ambulatory Visit: Payer: Medicare Other | Admitting: Cardiology

## 2012-01-10 ENCOUNTER — Other Ambulatory Visit (INDEPENDENT_AMBULATORY_CARE_PROVIDER_SITE_OTHER): Payer: Medicare Other

## 2012-01-10 VITALS — BP 160/100 | HR 93 | Ht 62.0 in | Wt 145.0 lb

## 2012-01-10 DIAGNOSIS — Z7409 Other reduced mobility: Secondary | ICD-10-CM | POA: Insufficient documentation

## 2012-01-10 DIAGNOSIS — I359 Nonrheumatic aortic valve disorder, unspecified: Secondary | ICD-10-CM

## 2012-01-10 DIAGNOSIS — I2510 Atherosclerotic heart disease of native coronary artery without angina pectoris: Secondary | ICD-10-CM

## 2012-01-10 DIAGNOSIS — Z79899 Other long term (current) drug therapy: Secondary | ICD-10-CM

## 2012-01-10 DIAGNOSIS — I10 Essential (primary) hypertension: Secondary | ICD-10-CM

## 2012-01-10 DIAGNOSIS — I350 Nonrheumatic aortic (valve) stenosis: Secondary | ICD-10-CM

## 2012-01-10 DIAGNOSIS — I4891 Unspecified atrial fibrillation: Secondary | ICD-10-CM

## 2012-01-10 DIAGNOSIS — R6889 Other general symptoms and signs: Secondary | ICD-10-CM

## 2012-01-10 LAB — CBC WITH DIFFERENTIAL/PLATELET
Basophils Absolute: 0.1 10*3/uL (ref 0.0–0.1)
Basophils Relative: 0.7 % (ref 0.0–3.0)
Eosinophils Relative: 7.1 % — ABNORMAL HIGH (ref 0.0–5.0)
HCT: 32.7 % — ABNORMAL LOW (ref 36.0–46.0)
Hemoglobin: 10.6 g/dL — ABNORMAL LOW (ref 12.0–15.0)
Lymphocytes Relative: 9.3 % — ABNORMAL LOW (ref 12.0–46.0)
Lymphs Abs: 0.7 10*3/uL (ref 0.7–4.0)
Monocytes Relative: 6.7 % (ref 3.0–12.0)
Neutro Abs: 5.9 10*3/uL (ref 1.4–7.7)
RBC: 3.25 Mil/uL — ABNORMAL LOW (ref 3.87–5.11)
RDW: 16.7 % — ABNORMAL HIGH (ref 11.5–14.6)

## 2012-01-10 NOTE — Assessment & Plan Note (Signed)
The patient complained of decreased ability to walk. We placed a note to monitor and her O2 sat remained 97% while she took a few steps in the office. It is clear that her problem is related to some decreased confidence in walking. Her gait is unsteady. She needs a more advanced walker.

## 2012-01-10 NOTE — Assessment & Plan Note (Signed)
The patient has known moderate aortic stenosis. No further workup.

## 2012-01-10 NOTE — Assessment & Plan Note (Signed)
Coronary disease is stable. No change in therapy. 

## 2012-01-10 NOTE — Assessment & Plan Note (Signed)
Atrial fibrillation rate is controlled. No change in therapy. She can no longer take Coumadin because of major GI bleeding.

## 2012-01-10 NOTE — Progress Notes (Signed)
Patient ID: [name], female   DOB: Jun 08, 1918, 78 y.o.   MRN: [id number]   HPI  Patient is seen to followup coronary disease and atrial fibrillation and aortic stenosis. Earlier this year she had a life-threatening GI bleed. Because of this she has been taken off of anticoagulants. She has some mild ongoing GI symptoms. In addition she is not walking much because she says it takes so much effort.  She does have pain in her left flank. This is chronic.  An additional problem is difficulty with walking.  Allergies  Allergen Reactions  . Penicillins   . Sulfa Drugs Cross Reactors   . Sulfonamide Derivatives     Current Outpatient Prescriptions  Medication Sig Dispense Refill  . ALPRAZolam (XANAX) 0.5 MG tablet Take 0.5 mg by mouth 3 (three) times daily as needed.        Marland Kitchen aspirin EC 81 MG tablet Take 1 tablet (81 mg total) by mouth daily.      Marland Kitchen atorvastatin (LIPITOR) 10 MG tablet Take 10 mg by mouth daily.      Marland Kitchen atorvastatin (LIPITOR) 10 MG tablet TAKE 1 TABLET (10 MG TOTAL) BY MOUTH DAILY.  30 tablet  6  . diltiazem (CARDIZEM CD) 240 MG 24 hr capsule       . diltiazem (CARDIZEM CD) 240 MG 24 hr capsule TAKE ONE CAPSULE BY MOUTH EVERY DAY  30 capsule  5  . metoprolol (LOPRESSOR) 50 MG tablet Take 50 mg by mouth 2 (two) times daily.      . metoprolol (LOPRESSOR) 50 MG tablet TAKE 1 TABLET BY MOUTH TWICE A DAY  60 tablet  6  . pantoprazole (PROTONIX) 40 MG tablet Take 1 tablet (40 mg total) by mouth daily.  90 tablet  3  . zolpidem (AMBIEN) 5 MG tablet Take 5 mg by mouth at bedtime as needed.          History   Social History  . Marital Status: Single    Spouse Name: N/A    Number of Children: N/A  . Years of Education: N/A   Occupational History  . retired    Social History Main Topics  . Smoking status: Never Smoker   . Smokeless tobacco: Never Used  . Alcohol Use: No  . Drug Use: No  . Sexually Active: No   Other Topics Concern  . Not on file   Social History  Narrative  . No narrative on file    Family History  Problem Relation Age of Onset  . Hypertension      Past Medical History  Diagnosis Date  . CAD (coronary artery disease)     Non-STEMI January, 2012.. bare-metal stents to ramus and circumflex, 80% small second diagonal to be followed  /   . Aortic stenosis, moderate     Moderate.. echo.. January 2 012; Echocardiogram 10/31/11: Mild LVH, EF 65-70%, moderate AS, mild AI, mean gradient 31, MAC, mild MR, severe LAE, moderate or severe TR, PASP 97.  . Aortic insufficiency     Mild... echo.. January, 2012  . Mitral regurgitation     EF 60%... echo... January, 2012,  mild MR, severe annular calcification  . HTN (hypertension)   . Diverticulosis   . Pulmonary HTN     67 mmHg.. echo.. January, 2012  . Renal arterial thrombosis     January, 2012 while on aspirin and Plavix for coronary stents ( atrial fibrillation) Plavix stopped and Coumadin started  . Renal insufficiency  Creatinine 1.4.. April 29, 2010  . Anemia     Hemoglobin 9.7, February, 2012  . Cirrhosis of liver     appearance On CT, January, 2012  . Drug therapy     Plavix used for bare-metal stent stopped after several weeks when Coumadin and started for renal artery thrombus  . Atrial fibrillation     New diagnosis January, 2012, Coumadin not used initially because of Plavix and aspirin and age  for stents, renal artery thrombus, Plavix stopped Coumadin started April 23, 2010  . Drug therapy     Coumadin.Marland Kitchen started April 23, 2010 after renal artery embolus (atrial fibrillation)  . Renal mass     Low density, upper pole left kidney, CT January 2 012, etiology unclear, MRI can be considered  . Renal artery stenosis     Suspected from abdominal CT January, 2012.. significant  . Lower GI bleed     8/13-required 5 units PRBCs and 3 units FFP; anticoagulation discontinued  . Anticoagulant-induced bleeding     Anticoagulants to be avoided after August, 2012 because of  life-threatening GI bleed  . Ejection fraction     EF 65-70%, echo, August, 2013    Past Surgical History  Procedure Date  . Total abdominal hysterectomy   . Dilation and curettage of uterus   . Cardiac catheterization January 2012    BMS to ramus and LCX; 80% lesion to small D1 followed    Patient Active Problem List  Diagnosis  . MITRAL REGURGITATION  . HYPERTENSION  . PULMONARY HYPERTENSION  . RENAL INSUFFICIENCY, CHRONIC  . A-fib  . Renal artery thrombosis  . CAD (coronary artery disease)  . Aortic stenosis, moderate  . Aortic insufficiency  . Mitral regurgitation  . HTN (hypertension)  . Pulmonary HTN  . Renal arterial thrombosis  . Renal insufficiency  . Anemia  . Cirrhosis of liver  . Drug therapy  . Atrial fibrillation  . Drug therapy  . Renal mass  . Renal artery stenosis  . Encounter for long-term (current) use of anticoagulants  . Hematochezia  . Diverticulosis  . GIB (gastrointestinal bleeding)  . Coagulopathy  . Anticoagulant-induced bleeding    ROS    Patient denies fever, chills, headache, sweats, rash, change in vision, change in hearing, chest pain, cough, urinary symptoms. All other systems are reviewed and are negative.  PHYSICAL EXAM   The patient is oriented to person time and place. Affect is normal. There is no jugular venous distention. Lungs are clear. Respiratory effort is not labored. Cardiac exam reveals S1 and S2. She does have a murmur of aortic stenosis. The abdomen is soft. There is no peripheral edema.  Filed Vitals:   01/10/12 1037  BP: 160/100  Pulse: 98  Height: 5\' 2"  (1.575 m)  Weight: 145 lb (65.772 kg)  SpO2: 95%   EKG is done today and reviewed by me. There is atrial fibrillation. The rate is controlled.  ASSESSMENT & PLAN

## 2012-01-10 NOTE — Assessment & Plan Note (Signed)
Blood pressure is controlled. No change in therapy. 

## 2012-01-10 NOTE — Patient Instructions (Addendum)
Your physician wants you to follow-up in: 6 months.   You will receive a reminder letter in the mail two months in advance. If you don't receive a letter, please call our office to schedule the follow-up appointment.  Your physician recommends that you continue on your current medications as directed. Please refer to the Current Medication list given to you today.  Your cardiologist, Dr Myrtis Ser, reccomends that you start using a walker

## 2012-01-11 ENCOUNTER — Telehealth: Payer: Self-pay | Admitting: *Deleted

## 2012-01-11 NOTE — Telephone Encounter (Signed)
pt notified about lab results today w/verbal understanding  

## 2012-01-11 NOTE — Telephone Encounter (Signed)
Message copied by Tarri Fuller on Thu Jan 11, 2012 10:16 AM ------      Message from: [place], [place] T      Created: Wed Jan 10, 2012  5:27 PM       Hgb stable.      Tereso Newcomer, PA-C  5:27 PM 01/10/2012

## 2012-02-25 IMAGING — CR DG CHEST 1V PORT
1 series · 1 of 1 positions shown · non-contrast
Comparison: None.

CLINICAL DATA: 89-year-old female with decreased level of
consciousness, chest pain.

PORTABLE CHEST - 1 VIEW

[AP]
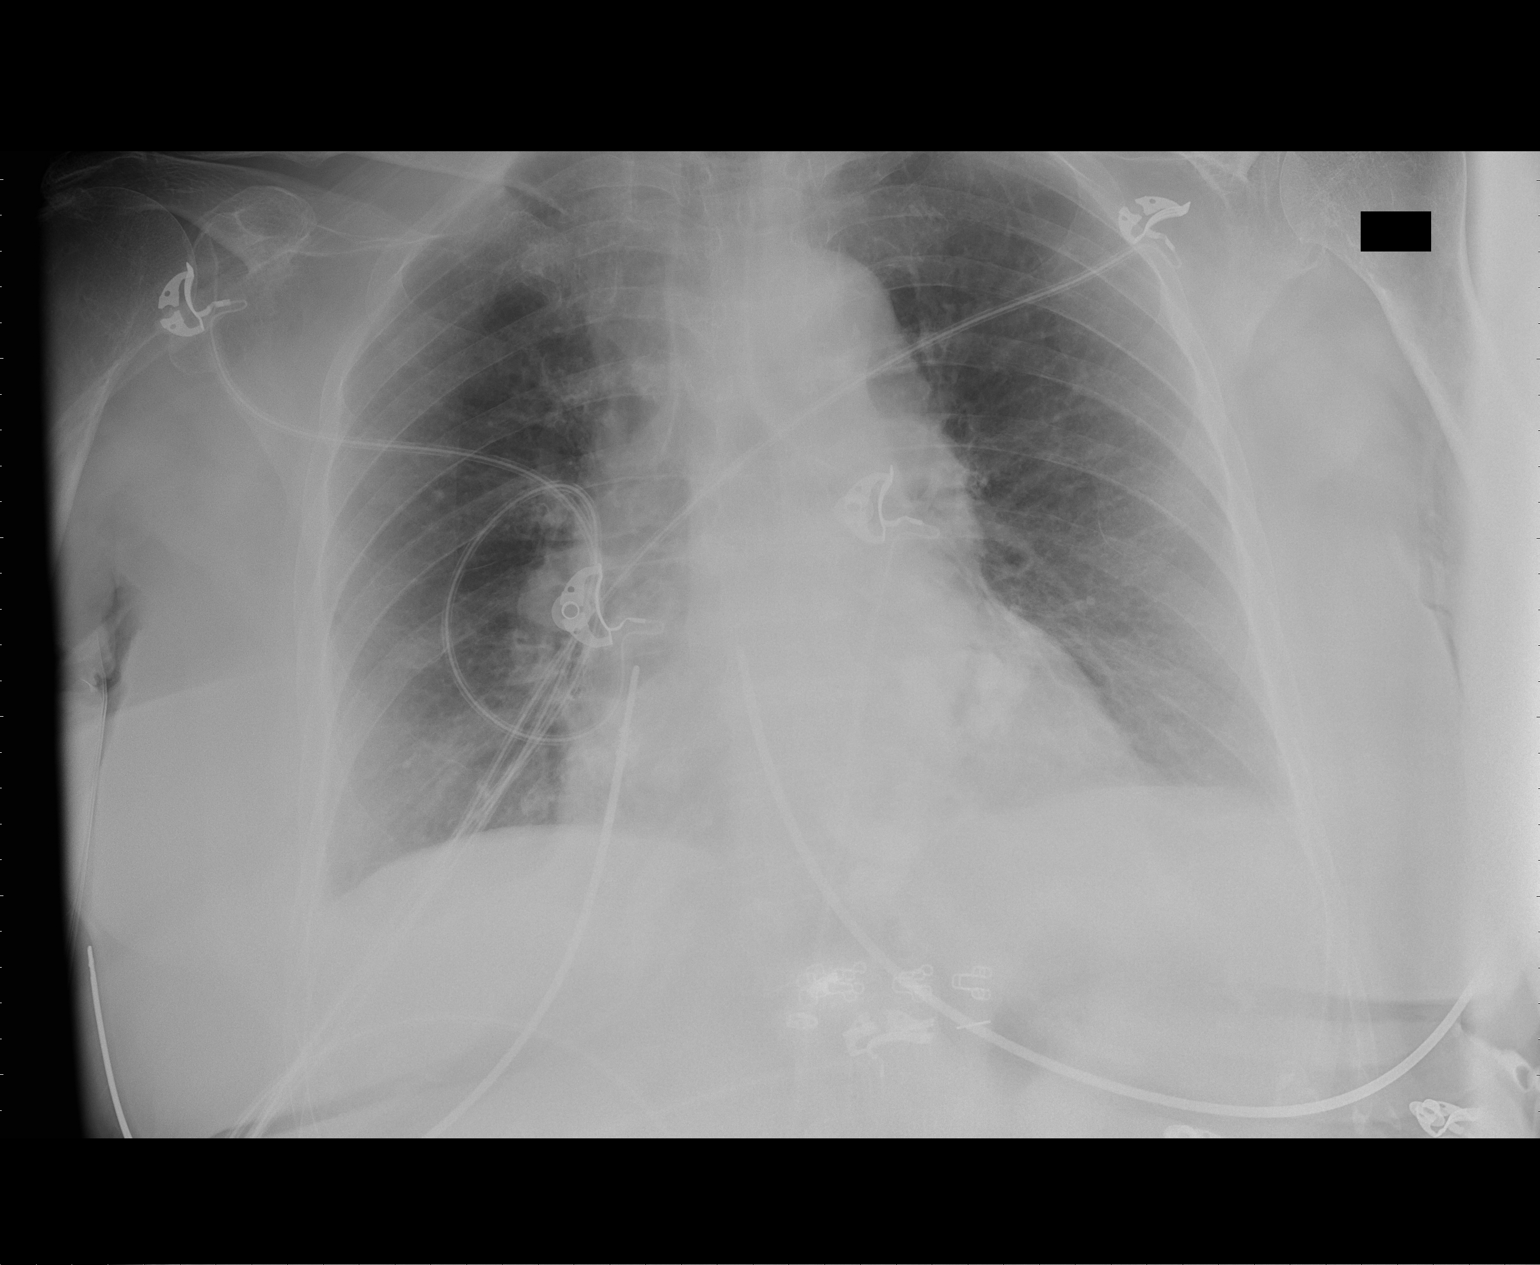

[1 of 1 positions shown; findings below may reference images not displayed]

FINDINGS: Portable AP view at 0086 hours.  Mild cardiomegaly. Other
mediastinal contours are within normal limits.  Probable mitral
annular calcification.  Lung volumes normal to low.  No
pneumothorax, pulmonary edema, pleural effusion or confluent
pulmonary opacity.
IMPRESSION: No acute cardiopulmonary abnormality.

## 2012-03-18 IMAGING — US US ABDOMEN COMPLETE
1 series · 13 of 25 positions shown · non-contrast
Comparison: Acute abdominal series 04/23/2010

CLINICAL DATA: Abdominal pain

COMPLETE ABDOMINAL ULTRASOUND

[Series 1: us abdomen complete · 0.28mm/px · 13 of 66 slices shown]
[im 1/66]
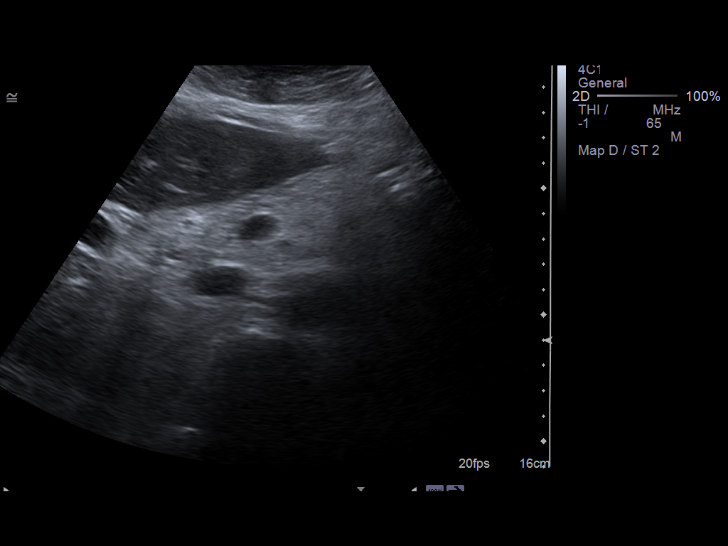
[im 6/66]
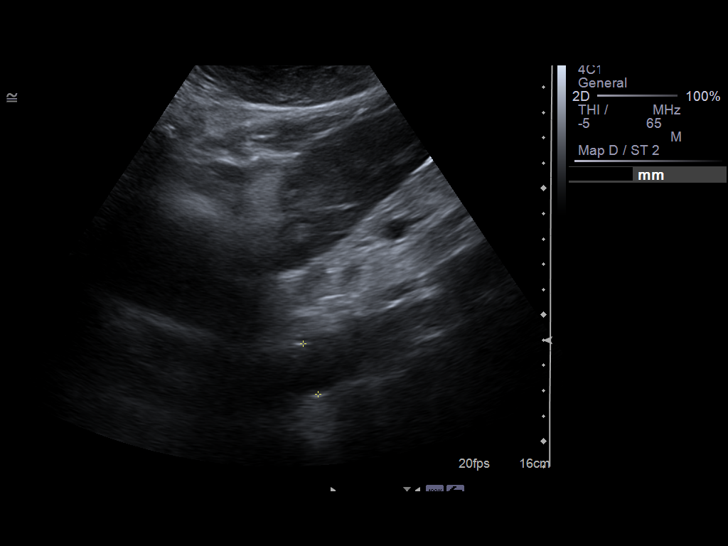
[im 11/66]
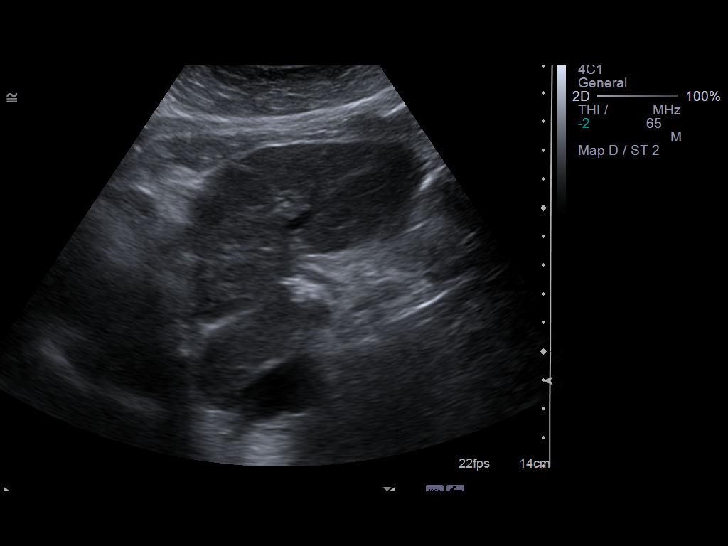
[im 17/66]
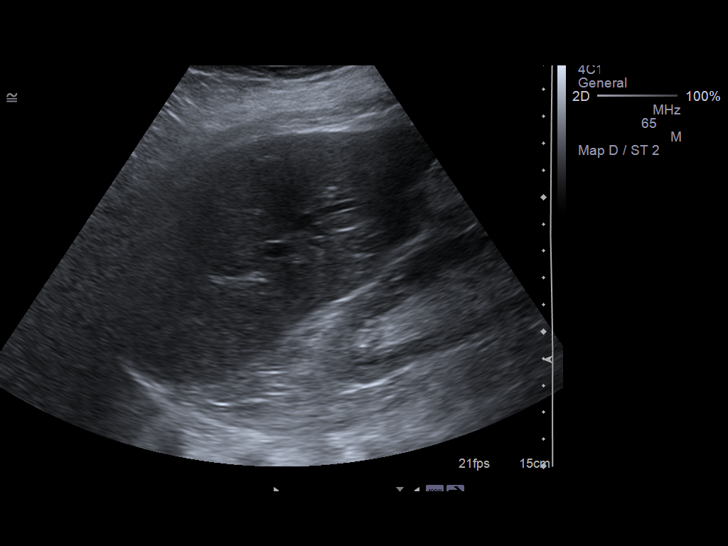
[im 22/66]
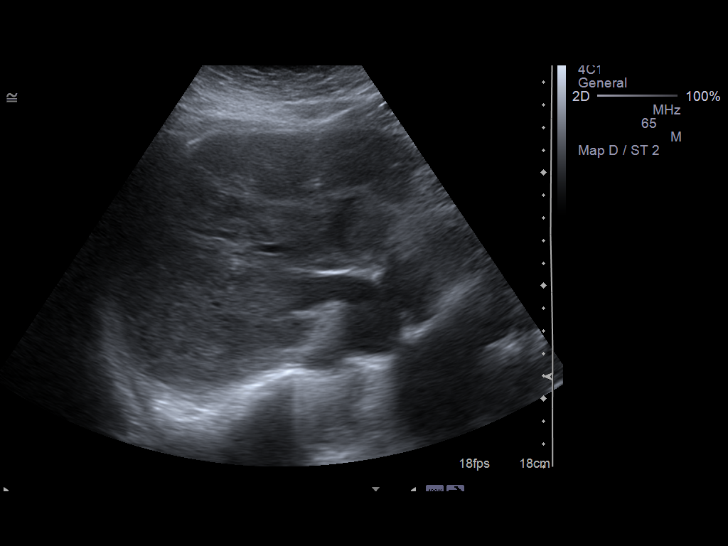
[im 28/66]
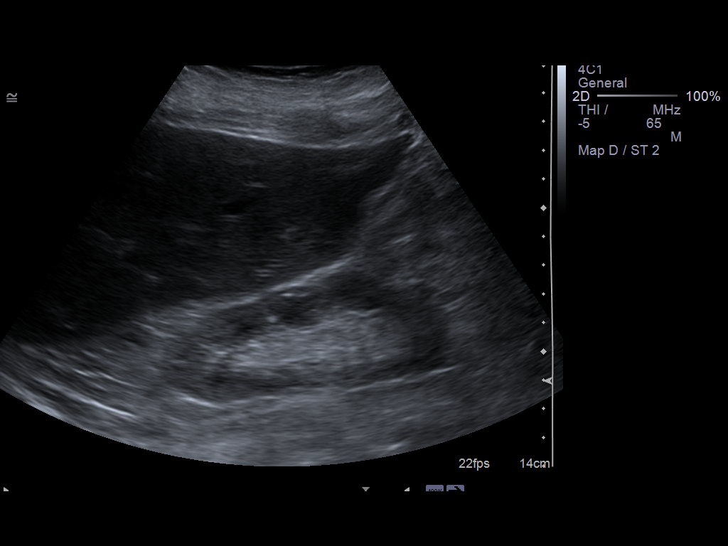
[im 33/66]
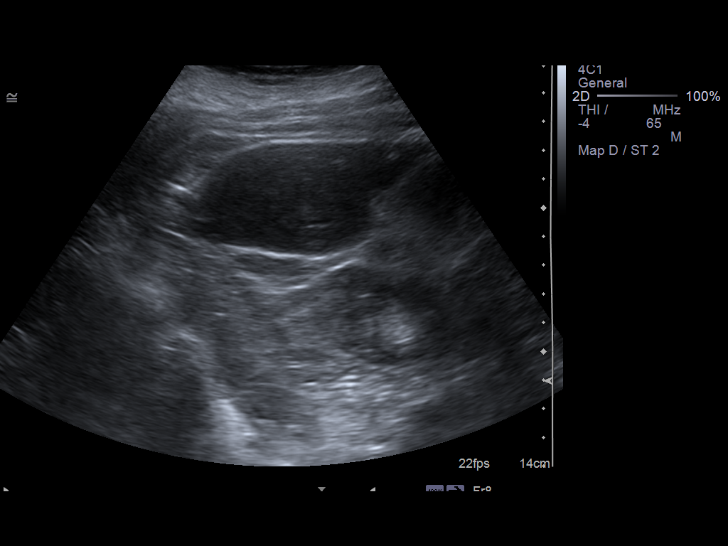
[im 38/66]
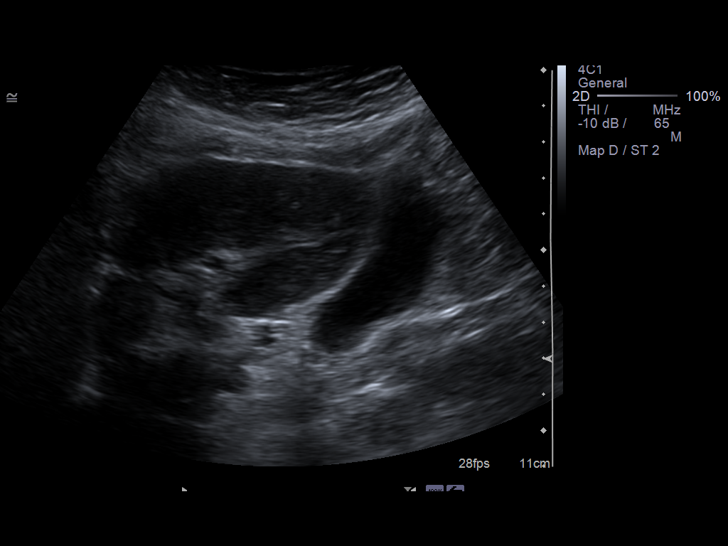
[im 44/66]
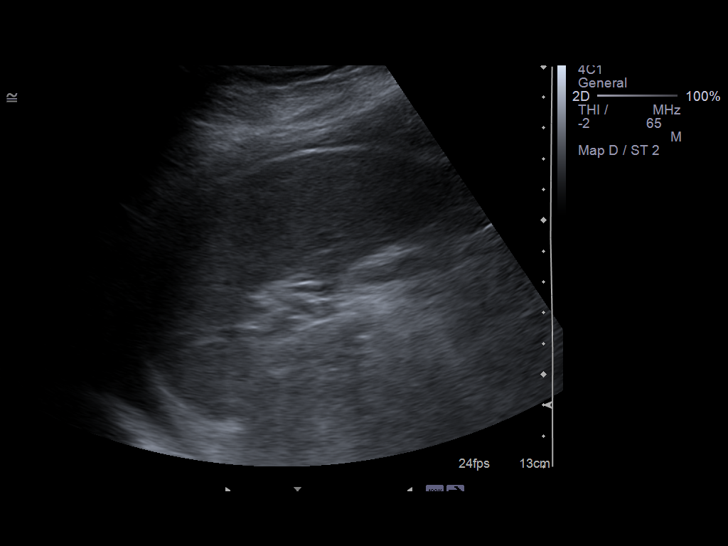
[im 49/66]
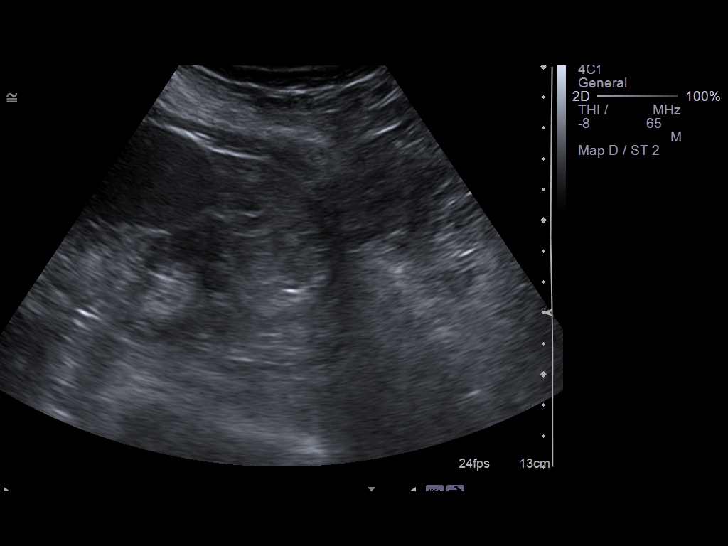
[im 55/66]
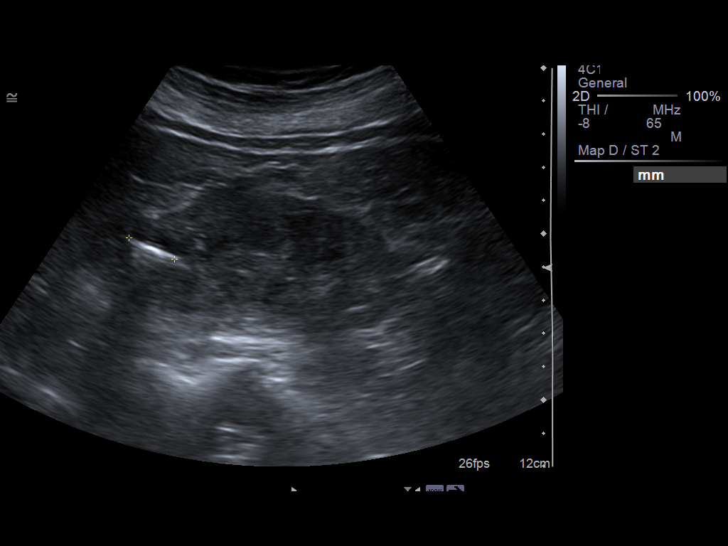
[im 60/66]
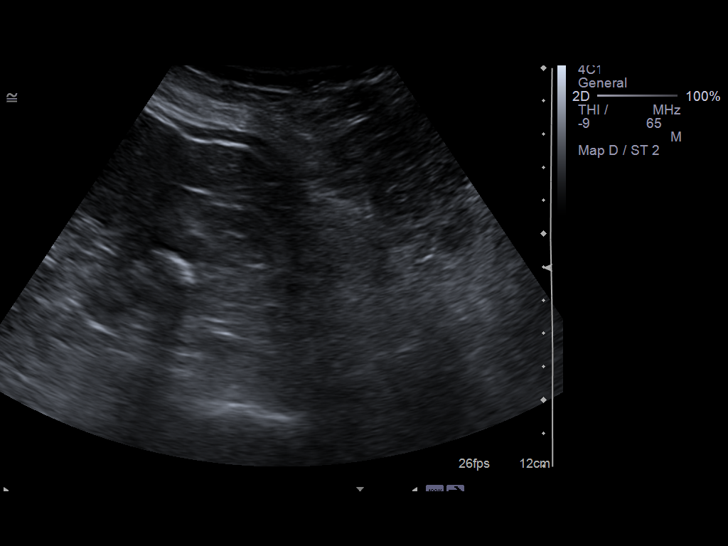
[im 66/66]
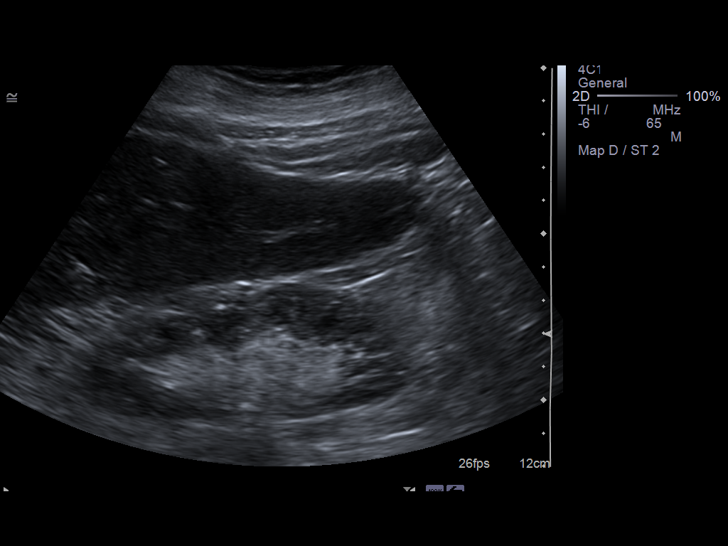

[13 of 25 positions shown; findings below may reference images not displayed]

FINDINGS: Gallbladder:  There is gallbladder wall thickening up to 4 mm.  No
pericholecystic fluid.  No echogenic gallstones.  Negative
sonographic Murphy's sign.

Common bile duct:  Mildly dilated at 8 mm.

Liver:  No focal lesion identified.  Within normal limits in
parenchymal echogenicity.

IVC:  Appears normal.

Pancreas:  No focal abnormality seen.

Spleen:  Normal in size and echogenicity.

Right Kidney:  10.5cm in length.  No evidence of hydronephrosis or
stones.

Left Kidney:  11.1cm in length.  Within the upper pole of the left
kidney there is a rounded echogenic focus without posterior
shadowing.  This has similar echogenicity to the renal sinus fat.

Abdominal aorta:  No aneurysm identified.
IMPRESSION: 1.  Thickened gallbladder wall without evidence of acute
cholecystitis.
2.  Mildly dilated common bile duct.  If the patient has elevated
LFTs, consider MRCP for further evaluation of potential
obstruction.
3.  Echogenic rounded lesion in the upper pole of the left kidney.
This may represent an angiomyolipoma or renal sinus fat.  Recommend
contrast enhanced  CT renal protocol or contrast  renal MRI for
further characterization.

## 2012-03-18 IMAGING — CR DG ABDOMEN ACUTE W/ 1V CHEST
3 series · 3 of 3 positions shown · non-contrast
Comparison: 04/05/2010.

CLINICAL DATA: Abdominal pain and periumbilical pain.

ACUTE ABDOMEN SERIES (ABDOMEN 2 VIEW & CHEST 1 VIEW)

[w chest pa]
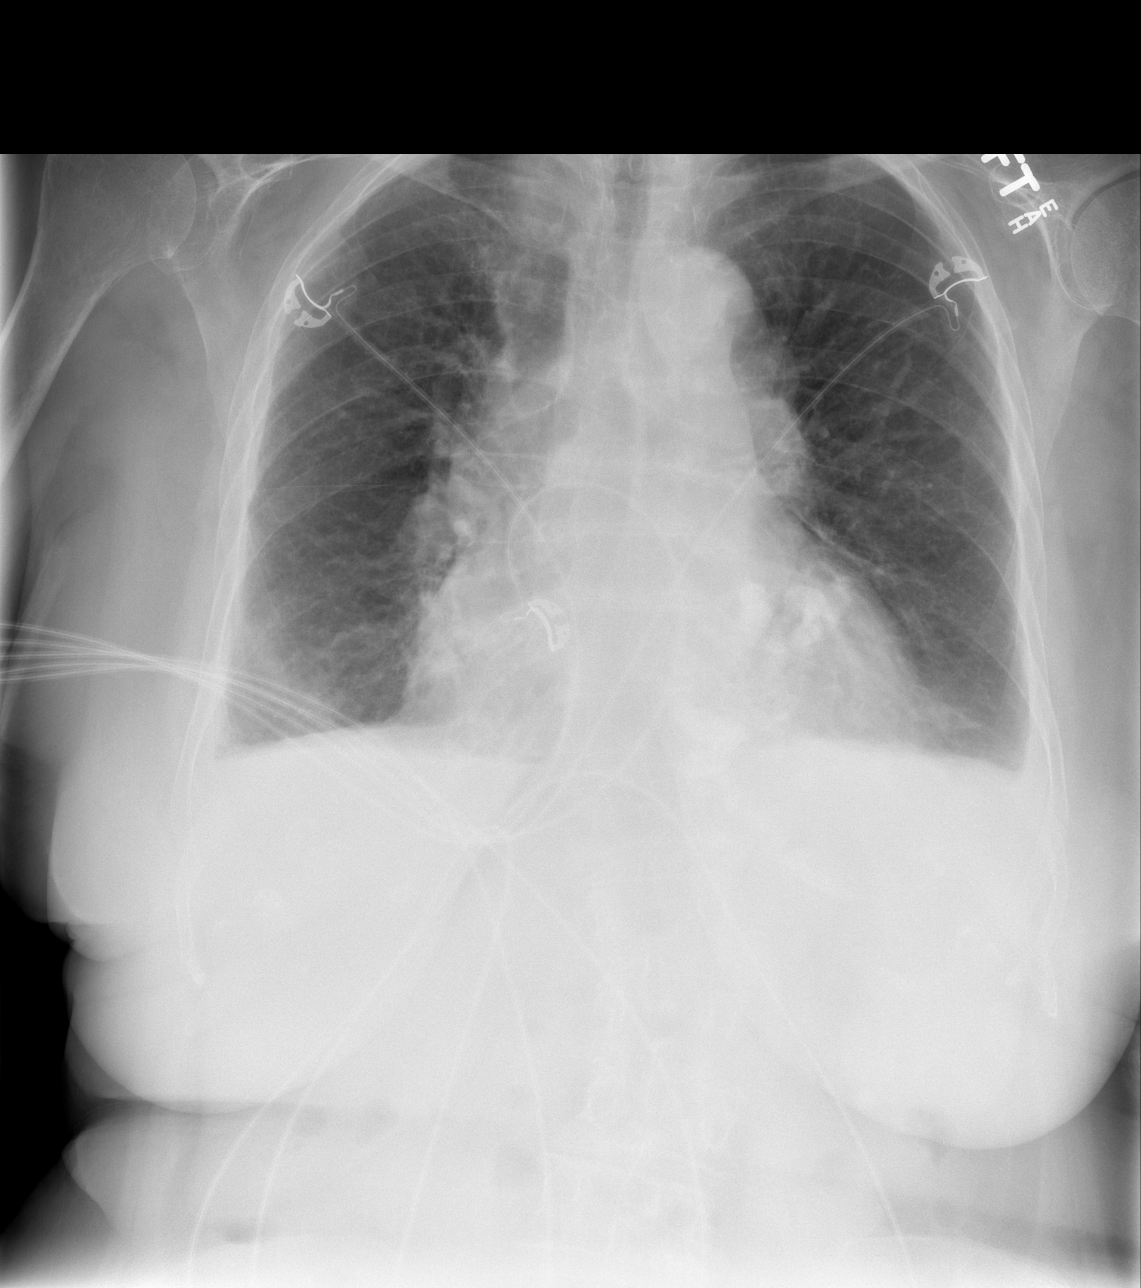

[w abdomen upright]
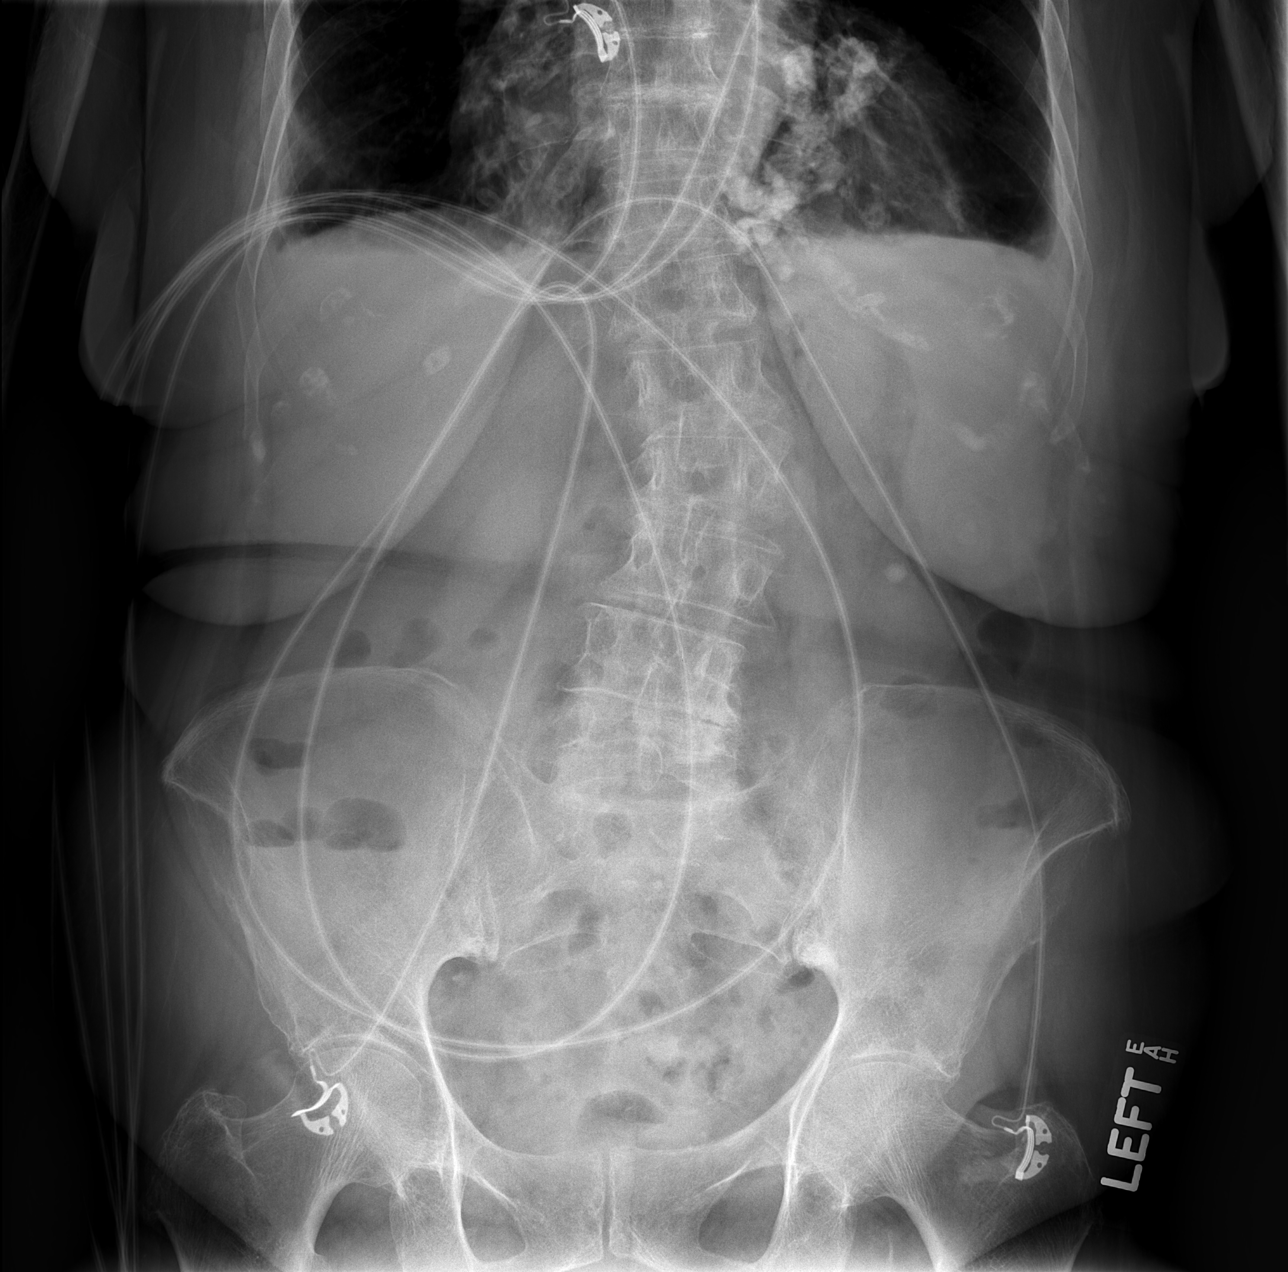

[t abdomen supine]
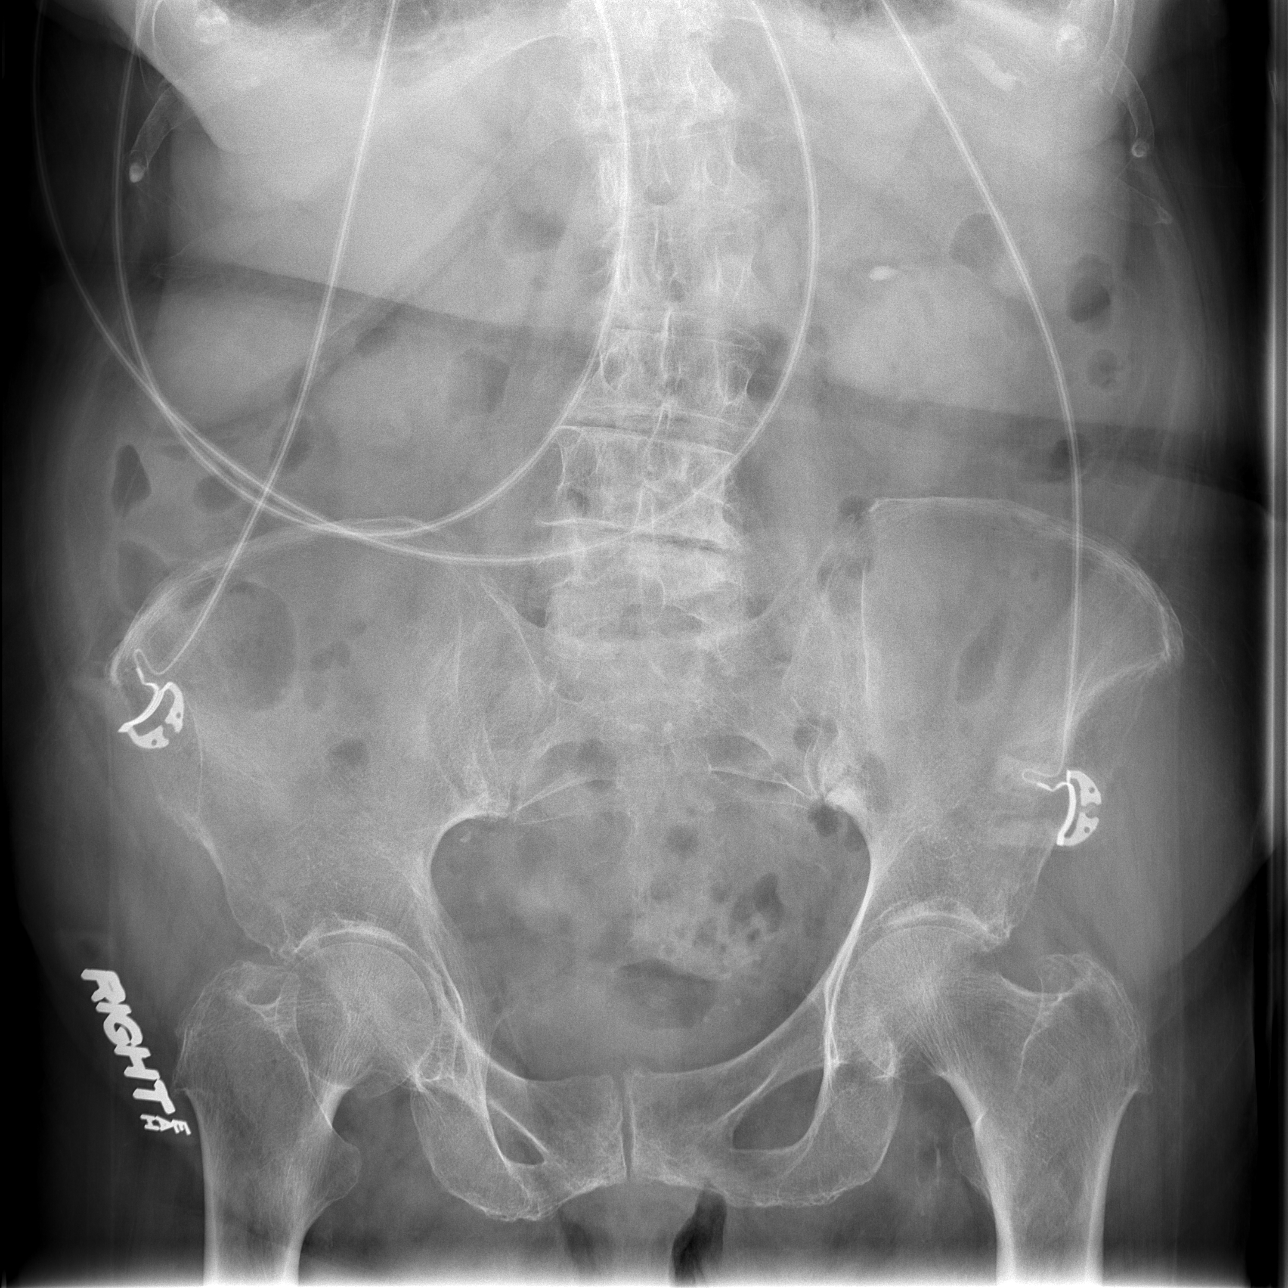

[3 of 3 positions shown; findings below may reference images not displayed]

FINDINGS: There is improved aeration of the left lung base compared
to most recent chest x-ray.  There are small pleural effusions seen
pulmonary vascular congestion is evident without pulmonary edema.
The heart is enlarged.  Dense mitral annular calcifications are
seen.  There is no intra-abdominal free air.  No dilated small
bowel loops are seen and there are no air fluid levels seen on the
upright exam.  Degenerative changes are seen in the hips and spine.
There is also scoliosis of the spine.
IMPRESSION: 1.  Improved aeration of the lung bases with decreasing pulmonary
edema compared to most recent exam.  Small pleural effusions
persist.
2.  Nonspecific, nonobstructive bowel gas pattern.

## 2012-04-03 ENCOUNTER — Other Ambulatory Visit: Payer: Self-pay | Admitting: Orthopedic Surgery

## 2012-04-03 DIAGNOSIS — M546 Pain in thoracic spine: Secondary | ICD-10-CM

## 2012-04-04 ENCOUNTER — Ambulatory Visit
Admission: RE | Admit: 2012-04-04 | Discharge: 2012-04-04 | Disposition: A | Discharge status: Home / Self Care | Payer: Medicare Other | Source: Ambulatory Visit | Attending: Orthopedic Surgery | Admitting: Orthopedic Surgery

## 2012-04-04 DIAGNOSIS — M546 Pain in thoracic spine: Secondary | ICD-10-CM

## 2012-05-30 ENCOUNTER — Other Ambulatory Visit: Payer: Self-pay

## 2012-06-03 MED ORDER — DILTIAZEM HCL ER COATED BEADS 240 MG PO CP24: 240.0000 mg | ORAL_CAPSULE | Freq: Every day | ORAL | Status: DC

## 2012-06-12 ENCOUNTER — Other Ambulatory Visit: Payer: Self-pay | Admitting: *Deleted

## 2012-06-12 MED ORDER — DILTIAZEM HCL ER COATED BEADS 240 MG PO CP24: 240.0000 mg | ORAL_CAPSULE | Freq: Every day | ORAL | Status: DC

## 2012-07-08 ENCOUNTER — Other Ambulatory Visit: Payer: Self-pay | Admitting: *Deleted

## 2012-07-08 MED ORDER — METOPROLOL TARTRATE 50 MG PO TABS: 50.0000 mg | ORAL_TABLET | Freq: Two times a day (BID) | ORAL | Status: DC

## 2012-07-08 MED ORDER — ATORVASTATIN CALCIUM 10 MG PO TABS: 10.0000 mg | ORAL_TABLET | Freq: Every day | ORAL | Status: DC

## 2012-07-11 ENCOUNTER — Ambulatory Visit (INDEPENDENT_AMBULATORY_CARE_PROVIDER_SITE_OTHER): Payer: Medicare Other | Admitting: Cardiology

## 2012-07-11 ENCOUNTER — Encounter: Payer: Self-pay | Admitting: Cardiology

## 2012-07-11 VITALS — BP 126/82 | HR 72 | Ht 62.0 in | Wt 125.0 lb

## 2012-07-11 DIAGNOSIS — I359 Nonrheumatic aortic valve disorder, unspecified: Secondary | ICD-10-CM

## 2012-07-11 DIAGNOSIS — I350 Nonrheumatic aortic (valve) stenosis: Secondary | ICD-10-CM

## 2012-07-11 DIAGNOSIS — I4891 Unspecified atrial fibrillation: Secondary | ICD-10-CM

## 2012-07-11 NOTE — Assessment & Plan Note (Signed)
Atrial fibrillation is under reasonable control. She cannot be anticoagulated. No change in therapy.

## 2012-07-11 NOTE — Patient Instructions (Addendum)
Your physician recommends that you continue on your current medications as directed. Please refer to the Current Medication list given to you today.  Your physician wants you to follow-up in: 1 year. You will receive a reminder letter in the mail two months in advance. If you don't receive a letter, please call our office to schedule the follow-up appointment.  

## 2012-07-11 NOTE — Progress Notes (Signed)
HPI  Patient is seen today to followup coronary disease and atrial fibrillation and aortic stenosis. She had a life-threatening bleed earlier in 2013. She was taken off anticoagulants at that time. She's been stable from the cardiac viewpoint. I saw her last October, 2013. She's not having any chest pain. She has back pain from her spine.  Allergies  Allergen Reactions  . Penicillins   . Sulfa Drugs Cross Reactors   . Sulfonamide Derivatives     Current Outpatient Prescriptions  Medication Sig Dispense Refill  . ALPRAZolam (XANAX) 0.5 MG tablet Take 0.5 mg by mouth 3 (three) times daily as needed.        Marland Kitchen aspirin EC 81 MG tablet Take 1 tablet (81 mg total) by mouth daily.      Marland Kitchen atorvastatin (LIPITOR) 10 MG tablet Take 1 tablet (10 mg total) by mouth daily.  30 tablet  6  . diltiazem (CARDIZEM CD) 240 MG 24 hr capsule Take 1 capsule (240 mg total) by mouth daily.  30 capsule  5  . metoprolol (LOPRESSOR) 50 MG tablet Take 1 tablet (50 mg total) by mouth 2 (two) times daily.  60 tablet  6  . pantoprazole (PROTONIX) 40 MG tablet Take 1 tablet (40 mg total) by mouth daily.  90 tablet  3  . zolpidem (AMBIEN) 5 MG tablet Take 5 mg by mouth at bedtime as needed.         No current facility-administered medications for this visit.    History   Social History  . Marital Status: Single    Spouse Name: N/A    Number of Children: N/A  . Years of Education: N/A   Occupational History  . retired    Social History Main Topics  . Smoking status: Never Smoker   . Smokeless tobacco: Never Used  . Alcohol Use: No  . Drug Use: No  . Sexually Active: No   Other Topics Concern  . Not on file   Social History Narrative  . No narrative on file    Family History  Problem Relation Age of Onset  . Hypertension      Past Medical History  Diagnosis Date  . CAD (coronary artery disease)     Non-STEMI January, 2012.. bare-metal stents to ramus and circumflex, 80% small second diagonal  to be followed  /   . Aortic stenosis, moderate     Moderate.. echo.. January 2 012; Echocardiogram 10/31/11: Mild LVH, EF 65-70%, moderate AS, mild AI, mean gradient 31, MAC, mild MR, severe LAE, moderate or severe TR, PASP 97.  . Aortic insufficiency     Mild... echo.. January, 2012  . Mitral regurgitation     EF 60%... echo... January, 2012,  mild MR, severe annular calcification  . HTN (hypertension)   . Diverticulosis   . Pulmonary HTN     67 mmHg.. echo.. January, 2012  . Renal arterial thrombosis     January, 2012 while on aspirin and Plavix for coronary stents ( atrial fibrillation) Plavix stopped and Coumadin started  . Renal insufficiency     Creatinine 1.4.. April 29, 2010  . Anemia     Hemoglobin 9.7, February, 2012  . Cirrhosis of liver     appearance On CT, January, 2012  . Drug therapy     Plavix used for bare-metal stent stopped after several weeks when Coumadin and started for renal artery thrombus  . Atrial fibrillation     New diagnosis January, 2012, Coumadin  not used initially because of Plavix and aspirin and age  for stents, renal artery thrombus, Plavix stopped Coumadin started April 23, 2010  . Drug therapy     Coumadin.Marland Kitchen started April 23, 2010 after renal artery embolus (atrial fibrillation)  . Renal mass     Low density, upper pole left kidney, CT January 2 012, etiology unclear, MRI can be considered  . Renal artery stenosis     Suspected from abdominal CT January, 2012.. significant  . Lower GI bleed     8/13-required 5 units PRBCs and 3 units FFP; anticoagulation discontinued  . Anticoagulant-induced bleeding     Anticoagulants to be avoided after August, 2012 because of life-threatening GI bleed  . Ejection fraction     EF 65-70%, echo, August, 2013  . Decreased ambulation status     Unsteady gait, October, 2013    Past Surgical History  Procedure Laterality Date  . Total abdominal hysterectomy    . Dilation and curettage of uterus    .  Cardiac catheterization  January 2012    BMS to ramus and LCX; 80% lesion to small D1 followed    Patient Active Problem List  Diagnosis  . MITRAL REGURGITATION  . HYPERTENSION  . PULMONARY HYPERTENSION  . RENAL INSUFFICIENCY, CHRONIC  . A-fib  . Renal artery thrombosis  . CAD (coronary artery disease)  . Aortic stenosis, moderate  . Aortic insufficiency  . Mitral regurgitation  . HTN (hypertension)  . Pulmonary HTN  . Renal arterial thrombosis  . Renal insufficiency  . Anemia  . Cirrhosis of liver  . Drug therapy  . Atrial fibrillation  . Drug therapy  . Renal mass  . Renal artery stenosis  . Hematochezia  . Diverticulosis  . GIB (gastrointestinal bleeding)  . Anticoagulant-induced bleeding  . Decreased ambulation status    ROS   Patient denies fever, chills, headache, sweats, rash, change in vision, change in hearing, chest pain, cough, nausea vomiting, urinary symptoms. All other systems are reviewed and are negative.  PHYSICAL EXAM  The patient is oriented to person. She is here with a helper. Lungs are clear. Respiratory effort is nonlabored. Cardiac exam reveals a crescendo decrescendo murmur of her aortic stenosis. The abdomen is soft. There is no peripheral edema.  Filed Vitals:   07/11/12 1342  BP: 126/82  Pulse: 72  Height: 5\' 2"  (1.575 m)  Weight: 125 lb (56.7 kg)  SpO2: 98%     ASSESSMENT & PLAN

## 2012-07-11 NOTE — Assessment & Plan Note (Signed)
Patient has moderate aortic stenosis by echo in August, 2013. I feel we should not do a follow up echo at this time.

## 2012-09-25 ENCOUNTER — Other Ambulatory Visit: Payer: Self-pay

## 2012-09-25 MED ORDER — METOPROLOL TARTRATE 50 MG PO TABS: 50.0000 mg | ORAL_TABLET | Freq: Two times a day (BID) | ORAL | Status: DC

## 2012-09-25 MED ORDER — ATORVASTATIN CALCIUM 10 MG PO TABS: 10.0000 mg | ORAL_TABLET | Freq: Every day | ORAL | Status: DC

## 2012-09-25 MED ORDER — DILTIAZEM HCL ER COATED BEADS 240 MG PO CP24: 240.0000 mg | ORAL_CAPSULE | Freq: Every day | ORAL | Status: DC

## 2012-09-25 NOTE — Telephone Encounter (Signed)
metoprolol (LOPRESSOR) 50 MG tablet  Take 1 tablet (50 mg total) by mouth 2 (two) times daily.  60 tablet  6

## 2012-09-25 NOTE — Telephone Encounter (Signed)
diltiazem (CARDIZEM CD) 240 MG 24 hr capsule  Take 1 capsule (240 mg total) by mouth daily.  30 capsule  5  atorvastatin (LIPITOR) 10 MG tablet  Take 1 tablet (10 mg total) by mouth daily.  30 tablet  6

## 2012-11-06 ENCOUNTER — Other Ambulatory Visit: Payer: Self-pay | Admitting: Internal Medicine

## 2012-12-04 ENCOUNTER — Other Ambulatory Visit: Payer: Self-pay | Admitting: Cardiology

## 2012-12-05 ENCOUNTER — Other Ambulatory Visit: Payer: Self-pay | Admitting: Cardiology

## 2013-03-20 ENCOUNTER — Other Ambulatory Visit: Payer: Self-pay | Admitting: Cardiology

## 2013-04-01 ENCOUNTER — Other Ambulatory Visit: Payer: Self-pay | Admitting: Cardiology

## 2013-05-21 ENCOUNTER — Other Ambulatory Visit: Payer: Self-pay | Admitting: Cardiology

## 2013-07-14 ENCOUNTER — Ambulatory Visit (INDEPENDENT_AMBULATORY_CARE_PROVIDER_SITE_OTHER): Payer: Medicare Other | Admitting: Cardiology

## 2013-07-14 ENCOUNTER — Encounter: Payer: Self-pay | Admitting: Cardiology

## 2013-07-14 VITALS — BP 150/89 | HR 84 | Ht 63.0 in | Wt 125.1 lb

## 2013-07-14 DIAGNOSIS — I4891 Unspecified atrial fibrillation: Secondary | ICD-10-CM

## 2013-07-14 DIAGNOSIS — I359 Nonrheumatic aortic valve disorder, unspecified: Secondary | ICD-10-CM

## 2013-07-14 DIAGNOSIS — I350 Nonrheumatic aortic (valve) stenosis: Secondary | ICD-10-CM

## 2013-07-14 DIAGNOSIS — I10 Essential (primary) hypertension: Secondary | ICD-10-CM

## 2013-07-14 DIAGNOSIS — I2510 Atherosclerotic heart disease of native coronary artery without angina pectoris: Secondary | ICD-10-CM

## 2013-07-14 LAB — BASIC METABOLIC PANEL
BUN: 20 mg/dL (ref 6–23)
CALCIUM: 9 mg/dL (ref 8.4–10.5)
CO2: 31 mEq/L (ref 19–32)
Chloride: 97 mEq/L (ref 96–112)
Creatinine, Ser: 1.2 mg/dL (ref 0.4–1.2)
GFR: 45.86 mL/min — AB (ref >60.00)
Glucose, Bld: 95 mg/dL (ref 70–99)
POTASSIUM: 3.1 meq/L — AB (ref 3.5–5.1)
Sodium: 140 mEq/L (ref 135–145)

## 2013-07-14 MED ORDER — TORSEMIDE 10 MG PO TABS: 10.0000 mg | ORAL_TABLET | Freq: Every day | ORAL | Status: DC

## 2013-07-14 NOTE — Patient Instructions (Signed)
Your physician recommends that you continue on your current medications as directed. Please refer to the Current Medication list given to you today.  Your physician recommends that you return for lab work in: today  Your physician wants you to follow-up in: 1 year. You will receive a reminder letter in the mail two months in advance. If you don't receive a letter, please call our office to schedule the follow-up appointment.    

## 2013-07-14 NOTE — Progress Notes (Signed)
Patient ID: [name], female   DOB: 02-03-1919, 80 y.o.   MRN: [id number]    HPI  Patient is seen today to followup history of coronary disease and atrial fibrillation and aortic stenosis. She is [age] and continues to be alert. She is here with her caregiver who understands the medical issues well. She had a life-threatening GI bleed in 2013. She had to be taken off her anticoagulants at that time despite having history of an embolus to her kidney in the past. Fortunately she remained stable. She does have aortic stenosis. She does have a tendency to retain fluid. However on a small dose of diuretics she is quite stable. Recently the dose was increased by her primary physician with good results.  Allergies  Allergen Reactions  . Penicillins   . Sulfa Drugs Cross Reactors   . Sulfonamide Derivatives     Current Outpatient Prescriptions  Medication Sig Dispense Refill  . ALPRAZolam (XANAX) 0.5 MG tablet Take 0.5 mg by mouth 3 (three) times daily as needed.        Marland Kitchen. atorvastatin (LIPITOR) 10 MG tablet TAKE 1 TABLET BY MOUTH EVERY DAY  90 tablet  1  . cyanocobalamin (,VITAMIN B-12,) 1000 MCG/ML injection Inject 1,000 mcg into the muscle every 30 (thirty) days.      Marland Kitchen. diltiazem (CARDIZEM CD) 240 MG 24 hr capsule TAKE ONE CAPSULE BY MOUTH EVERY DAY  30 capsule  2  . metoprolol (LOPRESSOR) 50 MG tablet TAKE 1 TABLET BY MOUTH TWICE A DAY  180 tablet  3  . pantoprazole (PROTONIX) 40 MG tablet TAKE 1 TABLET BY MOUTH EVERY DAY  90 tablet  3  . torsemide (DEMADEX) 5 MG tablet Take 10 mg by mouth daily.      Marland Kitchen. zolpidem (AMBIEN) 5 MG tablet Take 5 mg by mouth at bedtime as needed.         No current facility-administered medications for this visit.    History   Social History  . Marital Status: Single    Spouse Name: N/A    Number of Children: N/A  . Years of Education: N/A   Occupational History  . retired    Social History Main Topics  . Smoking status: Never Smoker   . Smokeless  tobacco: Never Used  . Alcohol Use: No  . Drug Use: No  . Sexual Activity: No   Other Topics Concern  . Not on file   Social History Narrative  . No narrative on file    Family History  Problem Relation Age of Onset  . Hypertension      Past Medical History  Diagnosis Date  . CAD (coronary artery disease)     Non-STEMI January, 2012.. bare-metal stents to ramus and circumflex, 80% small second diagonal to be followed  /   . Aortic stenosis, moderate     Moderate.. echo.. January 2 012; Echocardiogram 10/31/11: Mild LVH, EF 65-70%, moderate AS, mild AI, mean gradient 31, MAC, mild MR, severe LAE, moderate or severe TR, PASP 97.  . Aortic insufficiency     Mild... echo.. January, 2012  . Mitral regurgitation     EF 60%... echo... January, 2012,  mild MR, severe annular calcification  . HTN (hypertension)   . Diverticulosis   . Pulmonary HTN     67 mmHg.. echo.. January, 2012  . Renal arterial thrombosis     January, 2012 while on aspirin and Plavix for coronary stents ( atrial fibrillation) Plavix stopped and  Coumadin started  . Renal insufficiency     Creatinine 1.4.. April 29, 2010  . Anemia     Hemoglobin 9.7, February, 2012  . Cirrhosis of liver     appearance On CT, January, 2012  . Drug therapy     Plavix used for bare-metal stent stopped after several weeks when Coumadin and started for renal artery thrombus  . Atrial fibrillation     New diagnosis January, 2012, Coumadin not used initially because of Plavix and aspirin and age  for stents, renal artery thrombus, Plavix stopped Coumadin started April 23, 2010  . Drug therapy     Coumadin.Marland Kitchen. started April 23, 2010 after renal artery embolus (atrial fibrillation)  . Renal mass     Low density, upper pole left kidney, CT January 2 012, etiology unclear, MRI can be considered  . Renal artery stenosis     Suspected from abdominal CT January, 2012.. significant  . Lower GI bleed     8/13-required 5 units PRBCs and  3 units FFP; anticoagulation discontinued  . Anticoagulant-induced bleeding     Anticoagulants to be avoided after August, 2012 because of life-threatening GI bleed  . Ejection fraction     EF 65-70%, echo, August, 2013  . Decreased ambulation status     Unsteady gait, October, 2013    Past Surgical History  Procedure Laterality Date  . Total abdominal hysterectomy    . Dilation and curettage of uterus    . Cardiac catheterization  January 2012    BMS to ramus and LCX; 80% lesion to small D1 followed    Patient Active Problem List   Diagnosis Date Noted  . Decreased ambulation status   . Anticoagulant-induced bleeding   . GIB (gastrointestinal bleeding) 11/01/2011  . Hematochezia 10/31/2011  . Diverticulosis   . CAD (coronary artery disease)   . Aortic stenosis, moderate   . Aortic insufficiency   . Mitral regurgitation   . HTN (hypertension)   . Pulmonary HTN   . Renal arterial thrombosis   . Renal insufficiency   . Anemia   . Cirrhosis of liver   . Drug therapy   . Atrial fibrillation   . Drug therapy   . Renal mass   . Renal artery stenosis   . Renal artery thrombosis 05/31/2010  . RENAL INSUFFICIENCY, CHRONIC 05/16/2010  . PULMONARY HYPERTENSION 04/18/2010    ROS   Patient denies fever, chills, headache, sweats, rash, change in vision, change in hearing, chest pain, cough, nausea or vomiting, urinary symptoms. She does have back pain and has been told that nothing else can be done. All other systems are reviewed and are negative.  PHYSICAL EXAM  Patient is oriented to person time and place. Affect is normal. She is here with a caregiver. She seems to understand the plans as we discussed him. There is no jugular venous distention. Lungs today are clear. Cardiac exam reveals S1 and S2 and a murmur of aortic stenosis. The second heart sound is heard. Abdomen is soft. There is no significant peripheral edema.  Filed Vitals:   07/14/13 1008  BP: 150/89  Pulse: 84    Height: 5\' 3"  (1.6 m)  Weight: 125 lb 1.9 oz (56.754 kg)   EKG is done today and reviewed by me. There is no change in the QRS. The rhythm is atrial fibrillation and the rate is controlled.  ASSESSMENT & PLAN

## 2013-07-14 NOTE — Assessment & Plan Note (Signed)
She has moderate aortic stenosis by echo in 2013. There is no reason to repeat an echo at this time.

## 2013-07-14 NOTE — Assessment & Plan Note (Signed)
She cannot be anticoagulated. Her rate is controlled. Despite an embolus to her kidney in the past she remains stable.

## 2013-07-14 NOTE — Assessment & Plan Note (Signed)
There is reasonable blood pressure control for her age. No change in therapy.

## 2013-07-14 NOTE — Assessment & Plan Note (Signed)
Coronary status is stable. We cannot use any antiplatelet meds because of her bleeding.

## 2013-07-17 ENCOUNTER — Other Ambulatory Visit: Payer: Self-pay

## 2013-07-17 MED ORDER — POTASSIUM CHLORIDE ER 10 MEQ PO TBCR: 10.0000 meq | EXTENDED_RELEASE_TABLET | Freq: Every day | ORAL | Status: AC

## 2013-08-13 ENCOUNTER — Other Ambulatory Visit: Payer: Self-pay | Admitting: Cardiology

## 2013-08-13 MED ORDER — DILTIAZEM HCL ER COATED BEADS 240 MG PO CP24: ORAL_CAPSULE | ORAL | Status: DC

## 2013-09-22 ENCOUNTER — Inpatient Hospital Stay (HOSPITAL_COMMUNITY)
Admission: EM | Admit: 2013-09-22 | Discharge: 2013-09-25 | DRG: 291 | Disposition: A | Discharge status: Skilled Nursing Facility | Payer: Medicare Other | Attending: Internal Medicine | Admitting: Internal Medicine

## 2013-09-22 ENCOUNTER — Emergency Department (HOSPITAL_COMMUNITY): Payer: Medicare Other

## 2013-09-22 ENCOUNTER — Encounter (HOSPITAL_COMMUNITY): Payer: Self-pay | Admitting: Emergency Medicine

## 2013-09-22 DIAGNOSIS — J9601 Acute respiratory failure with hypoxia: Secondary | ICD-10-CM | POA: Diagnosis present

## 2013-09-22 DIAGNOSIS — Z9861 Coronary angioplasty status: Secondary | ICD-10-CM

## 2013-09-22 DIAGNOSIS — I209 Angina pectoris, unspecified: Secondary | ICD-10-CM

## 2013-09-22 DIAGNOSIS — I10 Essential (primary) hypertension: Secondary | ICD-10-CM | POA: Diagnosis present

## 2013-09-22 DIAGNOSIS — I359 Nonrheumatic aortic valve disorder, unspecified: Secondary | ICD-10-CM

## 2013-09-22 DIAGNOSIS — R0902 Hypoxemia: Secondary | ICD-10-CM

## 2013-09-22 DIAGNOSIS — I272 Pulmonary hypertension, unspecified: Secondary | ICD-10-CM | POA: Diagnosis present

## 2013-09-22 DIAGNOSIS — J9600 Acute respiratory failure, unspecified whether with hypoxia or hypercapnia: Secondary | ICD-10-CM | POA: Diagnosis present

## 2013-09-22 DIAGNOSIS — D689 Coagulation defect, unspecified: Secondary | ICD-10-CM | POA: Diagnosis present

## 2013-09-22 DIAGNOSIS — I482 Chronic atrial fibrillation, unspecified: Secondary | ICD-10-CM

## 2013-09-22 DIAGNOSIS — S82001D Unspecified fracture of right patella, subsequent encounter for closed fracture with routine healing: Secondary | ICD-10-CM

## 2013-09-22 DIAGNOSIS — I2510 Atherosclerotic heart disease of native coronary artery without angina pectoris: Secondary | ICD-10-CM | POA: Diagnosis present

## 2013-09-22 DIAGNOSIS — I5031 Acute diastolic (congestive) heart failure: Secondary | ICD-10-CM | POA: Diagnosis present

## 2013-09-22 DIAGNOSIS — I4891 Unspecified atrial fibrillation: Secondary | ICD-10-CM | POA: Diagnosis present

## 2013-09-22 DIAGNOSIS — D65 Disseminated intravascular coagulation [defibrination syndrome]: Secondary | ICD-10-CM

## 2013-09-22 DIAGNOSIS — I252 Old myocardial infarction: Secondary | ICD-10-CM

## 2013-09-22 DIAGNOSIS — Z79899 Other long term (current) drug therapy: Secondary | ICD-10-CM

## 2013-09-22 DIAGNOSIS — T457X1A Poisoning by anticoagulant antagonists, vitamin K and other coagulants, accidental (unintentional), initial encounter: Secondary | ICD-10-CM

## 2013-09-22 DIAGNOSIS — S82009A Unspecified fracture of unspecified patella, initial encounter for closed fracture: Secondary | ICD-10-CM | POA: Diagnosis present

## 2013-09-22 DIAGNOSIS — I25119 Atherosclerotic heart disease of native coronary artery with unspecified angina pectoris: Secondary | ICD-10-CM

## 2013-09-22 DIAGNOSIS — J189 Pneumonia, unspecified organism: Secondary | ICD-10-CM

## 2013-09-22 DIAGNOSIS — I5033 Acute on chronic diastolic (congestive) heart failure: Principal | ICD-10-CM | POA: Diagnosis present

## 2013-09-22 DIAGNOSIS — N2889 Other specified disorders of kidney and ureter: Secondary | ICD-10-CM | POA: Diagnosis present

## 2013-09-22 DIAGNOSIS — S82001A Unspecified fracture of right patella, initial encounter for closed fracture: Secondary | ICD-10-CM

## 2013-09-22 DIAGNOSIS — I513 Intracardiac thrombosis, not elsewhere classified: Secondary | ICD-10-CM | POA: Diagnosis present

## 2013-09-22 DIAGNOSIS — E876 Hypokalemia: Secondary | ICD-10-CM | POA: Diagnosis present

## 2013-09-22 DIAGNOSIS — I079 Rheumatic tricuspid valve disease, unspecified: Secondary | ICD-10-CM | POA: Diagnosis present

## 2013-09-22 DIAGNOSIS — D649 Anemia, unspecified: Secondary | ICD-10-CM | POA: Diagnosis present

## 2013-09-22 DIAGNOSIS — I2789 Other specified pulmonary heart diseases: Secondary | ICD-10-CM | POA: Diagnosis present

## 2013-09-22 DIAGNOSIS — W1811XA Fall from or off toilet without subsequent striking against object, initial encounter: Secondary | ICD-10-CM | POA: Diagnosis present

## 2013-09-22 DIAGNOSIS — Z66 Do not resuscitate: Secondary | ICD-10-CM | POA: Diagnosis present

## 2013-09-22 DIAGNOSIS — I5189 Other ill-defined heart diseases: Secondary | ICD-10-CM | POA: Diagnosis present

## 2013-09-22 DIAGNOSIS — I350 Nonrheumatic aortic (valve) stenosis: Secondary | ICD-10-CM | POA: Diagnosis present

## 2013-09-22 DIAGNOSIS — Z7982 Long term (current) use of aspirin: Secondary | ICD-10-CM

## 2013-09-22 DIAGNOSIS — I080 Rheumatic disorders of both mitral and aortic valves: Secondary | ICD-10-CM | POA: Diagnosis present

## 2013-09-22 LAB — I-STAT ARTERIAL BLOOD GAS, ED
ACID-BASE DEFICIT: 1 mmol/L (ref 0.0–2.0)
Bicarbonate: 23.6 mEq/L (ref 20.0–24.0)
O2 SAT: 95 %
PO2 ART: 76 mmHg — AB (ref 80.0–100.0)
Patient temperature: 98.6
TCO2: 25 mmol/L (ref 0–100)
pCO2 arterial: 36.4 mmHg (ref 35.0–45.0)
pH, Arterial: 7.42 (ref 7.350–7.450)

## 2013-09-22 LAB — CBC WITH DIFFERENTIAL/PLATELET
BASOS PCT: 0 % (ref 0–1)
Basophils Absolute: 0 10*3/uL (ref 0.0–0.1)
Eosinophils Absolute: 0.2 10*3/uL (ref 0.0–0.7)
Eosinophils Relative: 1 % (ref 0–5)
HEMATOCRIT: 33.2 % — AB (ref 36.0–46.0)
Hemoglobin: 11.2 g/dL — ABNORMAL LOW (ref 12.0–15.0)
LYMPHS PCT: 6 % — AB (ref 12–46)
Lymphs Abs: 0.8 10*3/uL (ref 0.7–4.0)
MCH: 34.5 pg — AB (ref 26.0–34.0)
MCHC: 33.7 g/dL (ref 30.0–36.0)
MCV: 102.2 fL — ABNORMAL HIGH (ref 78.0–100.0)
MONO ABS: 0.8 10*3/uL (ref 0.1–1.0)
Monocytes Relative: 6 % (ref 3–12)
Neutro Abs: 11.5 10*3/uL — ABNORMAL HIGH (ref 1.7–7.7)
Neutrophils Relative %: 87 % — ABNORMAL HIGH (ref 43–77)
Platelets: 155 10*3/uL (ref 150–400)
RBC: 3.25 MIL/uL — ABNORMAL LOW (ref 3.87–5.11)
RDW: 14.6 % (ref 11.5–15.5)
WBC: 13.3 10*3/uL — ABNORMAL HIGH (ref 4.0–10.5)

## 2013-09-22 LAB — BASIC METABOLIC PANEL
BUN: 25 mg/dL — ABNORMAL HIGH (ref 6–23)
CALCIUM: 9.1 mg/dL (ref 8.4–10.5)
CO2: 23 meq/L (ref 19–32)
Chloride: 102 mEq/L (ref 96–112)
Creatinine, Ser: 1.09 mg/dL (ref 0.50–1.10)
GFR calc Af Amer: 49 mL/min — ABNORMAL LOW (ref >90)
GFR calc non Af Amer: 42 mL/min — ABNORMAL LOW (ref >90)
Glucose, Bld: 143 mg/dL — ABNORMAL HIGH (ref 70–99)
Potassium: 3.4 mEq/L — ABNORMAL LOW (ref 3.7–5.3)
Sodium: 143 mEq/L (ref 137–147)

## 2013-09-22 LAB — APTT: aPTT: 34 seconds (ref 24–37)

## 2013-09-22 LAB — PROTIME-INR
INR: 1.4 (ref 0.00–1.49)
Prothrombin Time: 17.2 seconds — ABNORMAL HIGH (ref 11.6–15.2)

## 2013-09-22 LAB — TROPONIN I: Troponin I: <0.3 ng/mL (ref <0.30)

## 2013-09-22 LAB — PRO B NATRIURETIC PEPTIDE: Pro B Natriuretic peptide (BNP): 11659 pg/mL — ABNORMAL HIGH (ref 0–450)

## 2013-09-22 MED ORDER — DILTIAZEM HCL ER COATED BEADS 240 MG PO CP24
240.0000 mg | ORAL_CAPSULE | Freq: Every day | ORAL | Status: DC
  Administered 2013-09-23 – 2013-09-25 (×3): 240 mg via ORAL
  Filled 2013-09-22 (×4): qty 1

## 2013-09-22 MED ORDER — IOHEXOL 350 MG/ML SOLN
100.0000 mL | Freq: Once | INTRAVENOUS | Status: AC | PRN
  Administered 2013-09-22: 100 mL via INTRAVENOUS

## 2013-09-22 MED ORDER — POTASSIUM CHLORIDE ER 10 MEQ PO TBCR
10.0000 meq | EXTENDED_RELEASE_TABLET | Freq: Every day | ORAL | Status: DC
  Administered 2013-09-23 – 2013-09-25 (×3): 10 meq via ORAL
  Filled 2013-09-22 (×3): qty 1

## 2013-09-22 MED ORDER — DEXTROSE 5 % IV SOLN
1.0000 g | Freq: Once | INTRAVENOUS | Status: AC
  Administered 2013-09-22: 1 g via INTRAVENOUS
  Filled 2013-09-22: qty 10

## 2013-09-22 MED ORDER — SODIUM CHLORIDE 0.9 % IV SOLN
INTRAVENOUS | Status: DC
  Administered 2013-09-22: 21:00:00 via INTRAVENOUS

## 2013-09-22 MED ORDER — ASPIRIN EC 81 MG PO TBEC
81.0000 mg | DELAYED_RELEASE_TABLET | Freq: Every day | ORAL | Status: DC
  Administered 2013-09-23 – 2013-09-25 (×3): 81 mg via ORAL
  Filled 2013-09-22 (×3): qty 1

## 2013-09-22 MED ORDER — METOPROLOL TARTRATE 50 MG PO TABS
50.0000 mg | ORAL_TABLET | Freq: Two times a day (BID) | ORAL | Status: DC
  Administered 2013-09-23 – 2013-09-25 (×6): 50 mg via ORAL
  Filled 2013-09-22 (×7): qty 1

## 2013-09-22 MED ORDER — HEPARIN SODIUM (PORCINE) 5000 UNIT/ML IJ SOLN
5000.0000 [IU] | Freq: Three times a day (TID) | INTRAMUSCULAR | Status: DC
  Administered 2013-09-23 – 2013-09-25 (×7): 5000 [IU] via SUBCUTANEOUS
  Filled 2013-09-22 (×10): qty 1

## 2013-09-22 MED ORDER — FUROSEMIDE 10 MG/ML IJ SOLN
20.0000 mg | Freq: Once | INTRAMUSCULAR | Status: AC
  Administered 2013-09-23: 20 mg via INTRAVENOUS
  Filled 2013-09-22: qty 2

## 2013-09-22 MED ORDER — DEXTROSE 5 % IV SOLN
500.0000 mg | Freq: Once | INTRAVENOUS | Status: AC
  Administered 2013-09-22: 500 mg via INTRAVENOUS
  Filled 2013-09-22: qty 500

## 2013-09-22 MED ORDER — ALPRAZOLAM 0.5 MG PO TABS
0.5000 mg | ORAL_TABLET | Freq: Three times a day (TID) | ORAL | Status: DC | PRN
  Administered 2013-09-23 – 2013-09-24 (×3): 0.5 mg via ORAL
  Filled 2013-09-22 (×3): qty 1

## 2013-09-22 MED ORDER — ATORVASTATIN CALCIUM 10 MG PO TABS
10.0000 mg | ORAL_TABLET | Freq: Every day | ORAL | Status: DC
  Administered 2013-09-23 – 2013-09-24 (×2): 10 mg via ORAL
  Filled 2013-09-22 (×3): qty 1

## 2013-09-22 MED ORDER — FUROSEMIDE 10 MG/ML IJ SOLN: 40.0000 mg | Freq: Once | INTRAMUSCULAR | Status: DC

## 2013-09-22 MED ORDER — IPRATROPIUM-ALBUTEROL 0.5-2.5 (3) MG/3ML IN SOLN
3.0000 mL | Freq: Once | RESPIRATORY_TRACT | Status: AC
  Administered 2013-09-22: 3 mL via RESPIRATORY_TRACT
  Filled 2013-09-22: qty 3

## 2013-09-22 MED ORDER — ASPIRIN 81 MG PO CHEW
324.0000 mg | CHEWABLE_TABLET | Freq: Once | ORAL | Status: AC
  Administered 2013-09-22: 324 mg via ORAL
  Filled 2013-09-22: qty 4

## 2013-09-22 MED ORDER — ZOLPIDEM TARTRATE 5 MG PO TABS: 5.0000 mg | ORAL_TABLET | Freq: Every evening | ORAL | Status: DC | PRN

## 2013-09-22 MED ORDER — LEVOFLOXACIN IN D5W 750 MG/150ML IV SOLN
750.0000 mg | INTRAVENOUS | Status: DC
  Administered 2013-09-23: 750 mg via INTRAVENOUS
  Filled 2013-09-22: qty 150

## 2013-09-22 NOTE — ED Notes (Signed)
Per EMS: pt from home with shortness of breath that started an hour ago.  Pt sating 80% on RA.  Pt placed on NRB was sating 98%.  Pt had a fall at home with swelling to right knee.  Lungs clear per EMS. Pt with expiratory wheezing on arrival.  Pt reporting "why is it so hard for me to breath".

## 2013-09-22 NOTE — H&P (Signed)
[facility] History and Physical  Patient: [name]  ZOX:[id number]  DOB: 04/06/1918  DOS: the patient was seen and examined on 09/22/2013 PCP: Abigail Miyamoto, MD  Chief Complaint: Shortness of breath  HPI: [name] is a 80 y.o. female with Past medical history of [name] is a school aortic stenosis, mitral regurgitation, hypertension,  GI bleed, atrial fibrillation not on anticoagulation due to GI bleed, pulmonary hypertension. Patient presented with complaints of shortness of breath. She mentions this has been an ongoing issue since last a few days.She denies any fever, cough, vomiting, diarrhea. She was on her caudate and fell off the toilet and hit her right knee. No head injury or neck injury reported. She denies any orthopnea or PND. She denies any recent change in her medications. She was found to have significant hypoxia at the time of her arrival  Which gradually improved.  The patient is coming from home. And at her baseline independent for most of her ADL.  Review of Systems: as mentioned in the history of present illness.  A Comprehensive review of the other systems is negative.  Past Medical History  Diagnosis Date  . CAD (coronary artery disease)     Non-STEMI January, 2012.. bare-metal stents to ramus and circumflex, 80% small second diagonal to be followed  /   . Aortic stenosis, moderate     Moderate.. echo.. January 2 012; Echocardiogram 10/31/11: Mild LVH, EF 65-70%, moderate AS, mild AI, mean gradient 31, MAC, mild MR, severe LAE, moderate or severe TR, PASP 97.  . Aortic insufficiency     Mild... echo.. January, 2012  . Mitral regurgitation     EF 60%... echo... January, 2012,  mild MR, severe annular calcification  . HTN (hypertension)   . Diverticulosis   . Pulmonary HTN     67 mmHg.. echo.. January, 2012  . Renal arterial thrombosis     January, 2012 while on aspirin and Plavix for coronary stents ( atrial fibrillation) Plavix stopped and  Coumadin started  . Renal insufficiency     Creatinine 1.4.. April 29, 2010  . Anemia     Hemoglobin 9.7, February, 2012  . Cirrhosis of liver     appearance On CT, January, 2012  . Drug therapy     Plavix used for bare-metal stent stopped after several weeks when Coumadin and started for renal artery thrombus  . Atrial fibrillation     New diagnosis January, 2012, Coumadin not used initially because of Plavix and aspirin and age  for stents, renal artery thrombus, Plavix stopped Coumadin started April 23, 2010  . Drug therapy     Coumadin.Marland Kitchen started April 23, 2010 after renal artery embolus (atrial fibrillation)  . Renal mass     Low density, upper pole left kidney, CT January 2 012, etiology unclear, MRI can be considered  . Renal artery stenosis     Suspected from abdominal CT January, 2012.. significant  . Lower GI bleed     8/13-required 5 units PRBCs and 3 units FFP; anticoagulation discontinued  . Anticoagulant-induced bleeding     Anticoagulants to be avoided after August, 2012 because of life-threatening GI bleed  . Ejection fraction     EF 65-70%, echo, August, 2013  . Decreased ambulation status     Unsteady gait, October, 2013   Past Surgical History  Procedure Laterality Date  . Total abdominal hysterectomy    . Dilation and curettage of uterus    . Cardiac catheterization  January 2012    BMS to ramus and LCX; 80% lesion to small D1 followed   Social History:  reports that she has never smoked. She has never used smokeless tobacco. She reports that she does not drink alcohol or use illicit drugs.  Allergies  Allergen Reactions  . Penicillins Other (See Comments)    unknown  . Sulfa Drugs Cross Reactors Other (See Comments)    Unknown     Family History  Problem Relation Age of Onset  . Hypertension      Prior to Admission medications   Medication Sig Start Date End Date Taking? Authorizing Provider  ALPRAZolam Prudy Feeler(XANAX) 0.5 MG tablet Take 0.5 mg by  mouth 3 (three) times daily as needed.     Yes Historical Provider, MD  aspirin 81 MG tablet Take 81 mg by mouth daily.   Yes Historical Provider, MD  atorvastatin (LIPITOR) 10 MG tablet TAKE 1 TABLET BY MOUTH EVERY DAY 04/01/13  Yes Luis AbedJeffrey D Katz, MD  cyanocobalamin (,VITAMIN B-12,) 1000 MCG/ML injection Inject 1,000 mcg into the muscle every 30 (thirty) days.   Yes Historical Provider, MD  diltiazem (CARDIZEM CD) 240 MG 24 hr capsule TAKE ONE CAPSULE BY MOUTH EVERY DAY 08/13/13  Yes Luis AbedJeffrey D Katz, MD  metoprolol (LOPRESSOR) 50 MG tablet TAKE 1 TABLET BY MOUTH TWICE A DAY 03/20/13  Yes Luis AbedJeffrey D Katz, MD  potassium chloride (K-DUR) 10 MEQ tablet Take 1 tablet (10 mEq total) by mouth daily. 07/17/13  Yes Luis AbedJeffrey D Katz, MD  torsemide (DEMADEX) 10 MG tablet Take 1 tablet (10 mg total) by mouth daily. 07/14/13  Yes Luis AbedJeffrey D Katz, MD  zolpidem (AMBIEN) 5 MG tablet Take 5 mg by mouth at bedtime as needed.     Yes Historical Provider, MD    Physical Exam: Filed Vitals:   09/22/13 2000 09/22/13 2100 09/22/13 2302 09/22/13 2305  BP: 150/76 153/69  142/75  Pulse: 80 74  85  Temp:    98.3 F (36.8 C)  TempSrc:    Oral  Resp: 21 25  18   Height:   5\' 3"  (1.6 m)   Weight:   60 kg (132 lb 4.4 oz)   SpO2: 98% 93%  95%    General: Alert, Awake and Oriented to Time, Place and Person. Appear in mild distress Eyes: PERRL ENT: Oral Mucosa clear moist. Neck: Mild JVD Cardiovascular: S1 and S2 Present, Aortic systolic Murmur, Peripheral Pulses Present Respiratory: Bilateral Air entry equal and Decreased, Clear to Auscultation,  No Crackles,No wheezes Abdomen: Bowel Sound Present, Soft and Non tender Skin: Bruises no Rash Extremities: Trace Pedal edema, Right leg in immobilizer, No calf tenderness Neurologic: Grossly no focal neuro deficit.  Labs on Admission:  CBC:  Recent Labs Lab 09/22/13 1920  WBC 13.3*  NEUTROABS 11.5*  HGB 11.2*  HCT 33.2*  MCV 102.2*  PLT 155    CMP     Component  Value Date/Time   NA 143 09/22/2013 1920   K 3.4* 09/22/2013 1920   CL 102 09/22/2013 1920   CO2 23 09/22/2013 1920   GLUCOSE 143* 09/22/2013 1920   BUN 25* 09/22/2013 1920   CREATININE 1.09 09/22/2013 1920   CALCIUM 9.1 09/22/2013 1920   PROT 5.3* 11/02/2011 0636   ALBUMIN 2.9* 11/02/2011 0636   AST 18 11/02/2011 0636   ALT 12 11/02/2011 0636   ALKPHOS 58 11/02/2011 0636   BILITOT 1.0 11/02/2011 0636   GFRNONAA 42* 09/22/2013 1920   GFRAA 49* 09/22/2013  1920    No results found for this basename: LIPASE, AMYLASE,  in the last 168 hours No results found for this basename: AMMONIA,  in the last 168 hours   Recent Labs Lab 09/22/13 2055  TROPONINI <0.30   BNP (last 3 results)  Recent Labs  09/22/13 2055  PROBNP 11659.0*    Radiological Exams on Admission: Ct Angio Chest Pe W/cm &/or Wo Cm  09/22/2013   CLINICAL DATA:  Hypoxemia.  EXAM: CT ANGIOGRAPHY CHEST WITH CONTRAST  TECHNIQUE: Multidetector CT imaging of the chest was performed using the standard protocol during bolus administration of intravenous contrast. Multiplanar CT image reconstructions and MIPs were obtained to evaluate the vascular anatomy.  CONTRAST:  OMNIPAQUE IOHEXOL 350 MG/ML IV.  COMPARISON:  Unenhanced CT chest 04/04/2012.  No prior CTA.  FINDINGS: Contrast opacification of the pulmonary arteries is very good. No filling defects within either main pulmonary artery or their branches in either lung to suggest pulmonary embolism. Very large filling defect involving the left atrial appendage extending into the main portion of the left atrium, though this does not obstruct the pulmonary veins as they enter the atrium. Dense mitral annular calcification. Dense calcification involving the endocardium and myocardium of the left ventricular apex and lateral wall. Moderate aortic valvular calcification. Moderate to severe atherosclerosis involving the thoracic and upper abdominal aorta and their branches without evidence of aneurysm,  dissection, or significant stenosis. Replaced proper hepatic artery to the abdominal aorta. Marked right atrial enlargement with reflux of contrast into the intrahepatic IVC and hepatic veins. Severe 3 vessel coronary atherosclerosis. Small pericardial effusion.  Large right pleural effusion and small left pleural effusion. Passive atelectasis in the lower lobes, right greater than left. Mosaic attenuation in both lungs, with scattered areas of hyperlucency. Mild diffuse interstitial pulmonary edema. No confluent airspace consolidation. No pulmonary parenchymal nodules or masses.  Numerous normal-sized lymph nodes in the mediastinum, hila, and axilla. No significant lymphadenopathy. Visualized thyroid gland normal in appearance.  Visualized upper abdomen unremarkable apart from the findings described above. Bone window images demonstrate diffuse thoracic spondylosis, generalized osseous demineralization, and old fractures of the right lateral sixth, seventh and eighth ribs with incomplete union.  Review of the MIP images confirms the above findings.  IMPRESSION: 1. No evidence of pulmonary embolism. 2. Large thrombus involving the left atrial appendage and the main portion of the left atrium. The thrombus does not occlude the pulmonary veins at this time. 3. Old myocardial infarction involving the apex and lateral wall of the left ventricle with dense calcification. 4. Marked right atrial enlargement. Reflux of contrast into the IVC and hepatic veins is consistent with right heart failure and/or tricuspid regurgitation. 5. Severe 3 vessel coronary atherosclerosis. 6. Small pericardial effusion. 7. Large right pleural effusion and small left pleural effusion. 8. Mild CHF, with mild diffuse interstitial pulmonary edema. These results were called by telephone at the time of interpretation on 09/22/2013 at 9:06 PM to Dr. Rochele Raring , who verbally acknowledged these results.   Electronically Signed   By: Hulan Saas M.D.   On: 09/22/2013 21:06   Dg Chest Portable 1 View  09/22/2013   CLINICAL DATA:  Shortness of breath  EXAM: PORTABLE CHEST - 1 VIEW  COMPARISON:  April 27, 2010  FINDINGS: The heart size is enlarged. Mitral annular calcifications are noted. The mediastinum is stable. The aorta is tortuous. There is consolidation of the medial right lung base. There is a small right pleural effusion.  There is no pulmonary edema. No acute abnormalities identified within the visualized bones.  IMPRESSION: Right lung base pneumonia with small right pleural effusion.   Electronically Signed   By: Sherian ReinWei-Chen  Lin M.D.   On: 09/22/2013 19:59   Dg Knee Right Port  09/22/2013   CLINICAL DATA:  Larey SeatFell and injured right knee. Anterior and lateral pain and swelling.  EXAM: PORTABLE RIGHT KNEE - 1-2 VIEW  COMPARISON:  None.  FINDINGS: Comminuted fracture involving the patella. No other visible fractures. Mild medial and lateral compartment joint space narrowing. Chondrocalcinosis involving the lateral and medial menisci. Large joint effusion/hemarthrosis. Osseous demineralization. Femoropopliteal and tibioperoneal atherosclerosis.  IMPRESSION: 1. Comminuted fracture involving the patella. 2. Mild degenerative changes in the medial and lateral compartments secondary to CPPD. 3. Large joint effusion/hemarthrosis.  Sign report   Electronically Signed   By: Hulan Saashomas  Lawrence M.D.   On: 09/22/2013 20:00   EKG: Independently reviewed. atrial fibrillation, rate Controlled.  Assessment/Plan Principal Problem:   CAP (community acquired pneumonia) Active Problems:   CAD (coronary artery disease)   Aortic stenosis, moderate   HTN (hypertension)   Pulmonary HTN   Atrial fibrillation   Anticoagulant-induced bleeding   Left atrial thrombus   Right patella fracture   1. CAP (community acquired pneumonia) Patient presents with complaints of shortness of breath and a fall. Chest x-ray showing pneumonia, CT angiography is also  showing definite pleural effusion bilaterally with right-sided opesity. Currently She is on room air and does not appear in any acute distress. At present I would continue treating her with community-acquired pneumonia Levofloxacin. Follow blood culture and sputum culture and urine antigens.  2.Left atrial thrombus Pulmonary hypertension next item moderate aortic stenosis Coronary artery disease History of life-threatening GI bleed Atrial fibrillation Patient is not on any anticoagulation Despite atrial fibrillation Due to a life-threatening GI bleed in the past. Currently she is DO NOT RESUSCITATE and does not want any aggressive measures. At present as she is asymptomatic I would hold off on any anticoagulation and will discuss with patient's family cardiologist in morning for further course of action. We will get an echocardiogram in the morning  3.Right Patella fracture Patient is currently in a knee immobilizer There were discussed with orthopedic in the morning.  4.Hypertension Continue home medications Changing Toprol-XL to Lopressor 50 twice a day  Consults: Cardiology orthopedics  DVT Prophylaxis: subcutaneous Heparin Nutrition: Cardiac diet  Code Status: DNR/DNI  Disposition: Admitted to inpatient in telemetry unit.  Author: Lynden OxfordPranav [name], MD [facility] Pager: [phone] 09/22/2013, 11:31 PM    If 7PM-7AM, please contact night-coverage [email or web address] Password [email or web address]  **Disclaimer: This note may have been dictated with voice recognition software. Similar sounding words can inadvertently be transcribed and this note may contain transcription errors which may not have been corrected upon publication of note.**

## 2013-09-22 NOTE — ED Provider Notes (Signed)
TIME SEEN: 7:24 PM  CHIEF COMPLAINT: Shortness of breath  HPI: Patient is a 80 year old female with history of a fib not on anticoagulation, coronary artery disease, aortic stenosis, hypertension, pulmonary hypertension, chronic kidney disease who presents to the emergency department with shortness of breath. History is limited as patient is an extremely poor historian. Patient initially states that she has had shortness of breath for "years" but then her caregiver states that this started today. She does not wear oxygen at home. They deny that she's had any fevers, cough, vomiting or diarrhea. She did have a fall off the toilet today landing on her right knee. She denies hitting her head or losing consciousness. She states she is having some intermittent episodes of chest pain that she is unable to describe. She is unable to tell me if it radiates or describe her pain.  ROS: See HPI Constitutional: no fever  Eyes: no drainage  ENT: no runny nose   Cardiovascular:  no chest pain  Resp:  SOB  GI: no vomiting GU: no dysuria Integumentary: no rash  Allergy: no hives  Musculoskeletal: no leg swelling  Neurological: no slurred speech ROS otherwise negative  PAST MEDICAL HISTORY/PAST SURGICAL HISTORY:  Past Medical History  Diagnosis Date  . CAD (coronary artery disease)     Non-STEMI January, 2012.. bare-metal stents to ramus and circumflex, 80% small second diagonal to be followed  /   . Aortic stenosis, moderate     Moderate.. echo.. January 2 012; Echocardiogram 10/31/11: Mild LVH, EF 65-70%, moderate AS, mild AI, mean gradient 31, MAC, mild MR, severe LAE, moderate or severe TR, PASP 97.  . Aortic insufficiency     Mild... echo.. January, 2012  . Mitral regurgitation     EF 60%... echo... January, 2012,  mild MR, severe annular calcification  . HTN (hypertension)   . Diverticulosis   . Pulmonary HTN     67 mmHg.. echo.. January, 2012  . Renal arterial thrombosis     January, 2012  while on aspirin and Plavix for coronary stents ( atrial fibrillation) Plavix stopped and Coumadin started  . Renal insufficiency     Creatinine 1.4.. April 29, 2010  . Anemia     Hemoglobin 9.7, February, 2012  . Cirrhosis of liver     appearance On CT, January, 2012  . Drug therapy     Plavix used for bare-metal stent stopped after several weeks when Coumadin and started for renal artery thrombus  . Atrial fibrillation     New diagnosis January, 2012, Coumadin not used initially because of Plavix and aspirin and age  for stents, renal artery thrombus, Plavix stopped Coumadin started April 23, 2010  . Drug therapy     Coumadin.Marland Kitchen. started April 23, 2010 after renal artery embolus (atrial fibrillation)  . Renal mass     Low density, upper pole left kidney, CT January 2 012, etiology unclear, MRI can be considered  . Renal artery stenosis     Suspected from abdominal CT January, 2012.. significant  . Lower GI bleed     8/13-required 5 units PRBCs and 3 units FFP; anticoagulation discontinued  . Anticoagulant-induced bleeding     Anticoagulants to be avoided after August, 2012 because of life-threatening GI bleed  . Ejection fraction     EF 65-70%, echo, August, 2013  . Decreased ambulation status     Unsteady gait, October, 2013    MEDICATIONS:  Prior to Admission medications   Medication Sig Start Date  End Date Taking? Authorizing Provider  ALPRAZolam Prudy Feeler(XANAX) 0.5 MG tablet Take 0.5 mg by mouth 3 (three) times daily as needed.      Historical Provider, MD  atorvastatin (LIPITOR) 10 MG tablet TAKE 1 TABLET BY MOUTH EVERY DAY 04/01/13   Luis AbedJeffrey D Katz, MD  cyanocobalamin (,VITAMIN B-12,) 1000 MCG/ML injection Inject 1,000 mcg into the muscle every 30 (thirty) days.    Historical Provider, MD  diltiazem (CARDIZEM CD) 240 MG 24 hr capsule TAKE ONE CAPSULE BY MOUTH EVERY DAY 08/13/13   Luis AbedJeffrey D Katz, MD  metoprolol (LOPRESSOR) 50 MG tablet TAKE 1 TABLET BY MOUTH TWICE A DAY 03/20/13    Luis AbedJeffrey D Katz, MD  pantoprazole (PROTONIX) 40 MG tablet TAKE 1 TABLET BY MOUTH EVERY DAY 11/06/12   Beverley FiedlerJay M Pyrtle, MD  potassium chloride (K-DUR) 10 MEQ tablet Take 1 tablet (10 mEq total) by mouth daily. 07/17/13   Luis AbedJeffrey D Katz, MD  torsemide (DEMADEX) 10 MG tablet Take 1 tablet (10 mg total) by mouth daily. 07/14/13   Luis AbedJeffrey D Katz, MD  zolpidem (AMBIEN) 5 MG tablet Take 5 mg by mouth at bedtime as needed.      Historical Provider, MD    ALLERGIES:  Allergies  Allergen Reactions  . Penicillins   . Sulfa Drugs Cross Reactors   . Sulfonamide Derivatives     SOCIAL HISTORY:  History  Substance Use Topics  . Smoking status: Never Smoker   . Smokeless tobacco: Never Used  . Alcohol Use: No    FAMILY HISTORY: Family History  Problem Relation Age of Onset  . Hypertension      EXAM: BP 152/77  Pulse 85  Resp 28  SpO2 98% CONSTITUTIONAL: Alert and oriented and responds appropriately to questions. Well-appearing; well-nourished, elderly, nontoxic but is in moderate respiratory distress HEAD: Normocephalic EYES: Conjunctivae clear, PERRL ENT: normal nose; no rhinorrhea; moist mucous membranes; pharynx without lesions noted NECK: Supple, no meningismus, no LAD  CARD: RRR; S1 and S2 appreciated; no murmurs, no clicks, no rubs, no gallops RESP: Normal chest excursion without splinting; patient is tachypneic and hypoxic, lungs are clear to auscultation with good aeration, no rhonchi or rales or wheezing, patient in moderate respiratory distress ABD/GI: Normal bowel sounds; non-distended; soft, non-tender, no rebound, no guarding BACK:  The back appears normal and is non-tender to palpation, there is no CVA tenderness, no midline spinal tenderness or step-off or deformity EXT: Ecchymosis and swelling to the right knee with full range of motion in all joints, no tenderness over the pelvis or right hip, 2+ DP pulses bilaterally, sensation to light touch intact diffusely, Normal ROM in all  joints; otherwise extremities are non-tender to palpation; no edema; normal capillary refill; no cyanosis    SKIN: Normal color for age and race; warm NEURO: Moves all extremities equally PSYCH: The patient's mood and manner are appropriate. Grooming and personal hygiene are appropriate.  MEDICAL DECISION MAKING: Patient here with respiratory distress, hypoxia. Differential diagnosis includes pulmonary embolus, CHF exacerbation, pulmonary hypertension, ACS, pneumonia. We'll give DuoNeb treatment, obtain cardiac labs, ABG. We'll also obtain chest x-ray. Patient is satting 97% on 4 L nasal cannula distal to. She will need admission.  ED PROGRESS: Patient's chest x-ray shows a right infiltrate. This explains her leukocytosis and shortness of breath with new oxygen requirement. CT scan shows no pulmonary embolus. She also has some CHF DOES not appear volume overload on exam. Will hold Lasix at this time.. She has a large left atrial  thrombus that does not involve the pulmonary veins. This is likely due to her atrial fibrillation. She is not on anticoagulation. Her PCP is Dr. Eula Listen with [facility] physicians. We'll discuss with hospitalist for admission. Her right knee x-ray shows a comminuted fracture of her patella. To orthopedics can be consulted non-emergently. Will place the knee immobilizer.     Date: 09/22/2013  19:02  Rate: 85  Rhythm: A fib  QRS Axis: normal  Intervals: normal  ST/T Wave abnormalities: normal  Conduction Disutrbances: none  Narrative Interpretation: Atrial fibrillation, Q waves in inferior and anterior leads is unchanged compared to prior, no new ischemic changes compared to prior EKG April 2015   CRITICAL CARE Performed by: Raelyn Number   Total critical care time: 45 minutes  Critical care time was exclusive of separately billable procedures and treating other patients.  Critical care was necessary to treat or prevent imminent or life-threatening  deterioration.  Critical care was time spent personally by me on the following activities: development of treatment plan with patient and/or surrogate as well as nursing, discussions with consultants, evaluation of patient's response to treatment, examination of patient, obtaining history from patient or surrogate, ordering and performing treatments and interventions, ordering and review of laboratory studies, ordering and review of radiographic studies, pulse oximetry and re-evaluation of patient's condition.       Layla Maw Ward, DO 09/22/13 2139

## 2013-09-22 NOTE — Progress Notes (Signed)
Patient transferred from ED via stretcher to room 3E29. Explained call bell system and educated patient to the heart failure floor. At this time patient complains of no pain or discomfort. Will continue to monitor patient to end of shift.

## 2013-09-23 DIAGNOSIS — J9601 Acute respiratory failure with hypoxia: Secondary | ICD-10-CM | POA: Diagnosis present

## 2013-09-23 DIAGNOSIS — I369 Nonrheumatic tricuspid valve disorder, unspecified: Secondary | ICD-10-CM

## 2013-09-23 DIAGNOSIS — I5033 Acute on chronic diastolic (congestive) heart failure: Principal | ICD-10-CM

## 2013-09-23 DIAGNOSIS — J9600 Acute respiratory failure, unspecified whether with hypoxia or hypercapnia: Secondary | ICD-10-CM

## 2013-09-23 DIAGNOSIS — I5031 Acute diastolic (congestive) heart failure: Secondary | ICD-10-CM | POA: Diagnosis present

## 2013-09-23 LAB — COMPREHENSIVE METABOLIC PANEL
ALBUMIN: 3.2 g/dL — AB (ref 3.5–5.2)
ALK PHOS: 101 U/L (ref 39–117)
ALT: 10 U/L (ref 0–35)
AST: 18 U/L (ref 0–37)
BUN: 21 mg/dL (ref 6–23)
CO2: 25 mEq/L (ref 19–32)
Calcium: 8.7 mg/dL (ref 8.4–10.5)
Chloride: 102 mEq/L (ref 96–112)
Creatinine, Ser: 0.97 mg/dL (ref 0.50–1.10)
GFR calc Af Amer: 57 mL/min — ABNORMAL LOW (ref >90)
GFR calc non Af Amer: 49 mL/min — ABNORMAL LOW (ref >90)
Glucose, Bld: 110 mg/dL — ABNORMAL HIGH (ref 70–99)
POTASSIUM: 3.2 meq/L — AB (ref 3.7–5.3)
SODIUM: 142 meq/L (ref 137–147)
TOTAL PROTEIN: 6 g/dL (ref 6.0–8.3)
Total Bilirubin: 0.7 mg/dL (ref 0.3–1.2)

## 2013-09-23 LAB — CBC WITH DIFFERENTIAL/PLATELET
BASOS ABS: 0 10*3/uL (ref 0.0–0.1)
BASOS PCT: 0 % (ref 0–1)
Eosinophils Absolute: 0.3 10*3/uL (ref 0.0–0.7)
Eosinophils Relative: 3 % (ref 0–5)
HEMATOCRIT: 26.8 % — AB (ref 36.0–46.0)
Hemoglobin: 9 g/dL — ABNORMAL LOW (ref 12.0–15.0)
Lymphocytes Relative: 8 % — ABNORMAL LOW (ref 12–46)
Lymphs Abs: 0.8 10*3/uL (ref 0.7–4.0)
MCH: 34.1 pg — ABNORMAL HIGH (ref 26.0–34.0)
MCHC: 33.6 g/dL (ref 30.0–36.0)
MCV: 101.5 fL — AB (ref 78.0–100.0)
MONO ABS: 0.5 10*3/uL (ref 0.1–1.0)
Monocytes Relative: 5 % (ref 3–12)
Neutro Abs: 7.6 10*3/uL (ref 1.7–7.7)
Neutrophils Relative %: 83 % — ABNORMAL HIGH (ref 43–77)
Platelets: 118 10*3/uL — ABNORMAL LOW (ref 150–400)
RBC: 2.64 MIL/uL — ABNORMAL LOW (ref 3.87–5.11)
RDW: 14.6 % (ref 11.5–15.5)
WBC: 9.2 10*3/uL (ref 4.0–10.5)

## 2013-09-23 LAB — STREP PNEUMONIAE URINARY ANTIGEN: Strep Pneumo Urinary Antigen: NEGATIVE

## 2013-09-23 LAB — TROPONIN I
Troponin I: <0.3 ng/mL (ref <0.30)
Troponin I: <0.3 ng/mL (ref <0.30)

## 2013-09-23 MED ORDER — HYDROCODONE-ACETAMINOPHEN 5-325 MG PO TABS
1.0000 | ORAL_TABLET | ORAL | Status: DC | PRN
  Administered 2013-09-23 – 2013-09-24 (×5): 1 via ORAL
  Filled 2013-09-23 (×5): qty 1

## 2013-09-23 MED ORDER — PNEUMOCOCCAL VAC POLYVALENT 25 MCG/0.5ML IJ INJ
0.5000 mL | INJECTION | INTRAMUSCULAR | Status: AC
  Administered 2013-09-24: 0.5 mL via INTRAMUSCULAR
  Filled 2013-09-23: qty 0.5

## 2013-09-23 MED ORDER — POLYETHYLENE GLYCOL 3350 17 G PO PACK
17.0000 g | PACK | Freq: Every day | ORAL | Status: DC
  Administered 2013-09-23 – 2013-09-25 (×3): 17 g via ORAL
  Filled 2013-09-23 (×3): qty 1

## 2013-09-23 MED ORDER — FUROSEMIDE 10 MG/ML IJ SOLN
40.0000 mg | Freq: Two times a day (BID) | INTRAMUSCULAR | Status: AC
  Administered 2013-09-23 – 2013-09-24 (×4): 40 mg via INTRAVENOUS
  Filled 2013-09-23 (×2): qty 4

## 2013-09-23 NOTE — Progress Notes (Signed)
OT Cancellation Note  Patient Details Name: [name] MRN: [id number] DOB: 1918/11/19   Cancelled Treatment:    Reason Eval/Treat Not Completed: Medical issues which prohibited therapy (Awaiting ortho consult for patellar fx.)  Evern BioMayberry, Julie Lynn 09/23/2013, 10:11 AM [phone]

## 2013-09-23 NOTE — Care Management Note (Addendum)
    Page 1 of 1   09/25/2013     11:57:00 AM CARE MANAGEMENT NOTE 09/25/2013  Patient:  Cindee SaltLOWE,Miyonna MAE   Account Number:  [id number]  Date Initiated:  09/23/2013  Documentation initiated by:  [facility]Reed Dady  Subjective/Objective Assessment:   80 y.o. female with PMHx of Simonne ComeLeo is a school aortic stenosis, mitral regurgitation, HTN,  GI bleed, AFIB not on anticoagulation due to GI bleed, pulmonary HTN.  Presented with SOB.//Home alone.     Action/Plan:   IV abx.  [facility]   Anticipated DC Date:  09/26/2013   Anticipated DC Plan:  SKILLED NURSING FACILITY  In-house referral  Clinical Social Worker      DC Planning Services  CM consult      Choice offered to / List presented to:  C-4 Adult Children           Status of service:  Completed, signed off Medicare Important Message given?  YES (If response is "NO", the following Medicare IM given date fields will be blank) Date Medicare IM given:  09/25/2013 Medicare IM given by:  [facility]Shon [place] Date Additional Medicare IM given:   Additional Medicare IM given by:    Discharge Disposition:    Per UR Regulation:  Reviewed for med. necessity/level of care/duration of stay  If discussed at Long Length of Stay Meetings, dates discussed:    Comments:  09/25/13 1045 Oletta Cohnamellia [place]au, RN, BSN, [place] [phone] Clinical Social Work is seeking post-discharge placement for this patient at the following level of care:   SKILLED NURSING.

## 2013-09-23 NOTE — Progress Notes (Signed)
[facility] PROGRESS NOTE  Assessment/Plan: Acute resp. failure with hypoxia due to acute diastolic heart failure:: - D/c Rocephin and azithromycin 6.29.2015.Afebrile no leukocytosis. - has + JVD with + HJ reflex. No cough. - start IV lasix, daily weight strict I and o's, monitor electrolytes.  Left atrial thrombus - Patient is not on any anticoagulation Despite atrial fibrillation Due to a life-threatening GI bleed in 2013,cause of GI bleed in 2013 was diverticular.. Currently she is DO NOT RESUSCITATE  - consulted cardiology rec start ASA, hold anticoagulation, check FOBT trend Hbg.   Atrial fibrillation - rate controlled. - not on anticoagulation due to h/o diverticular bleed.  Patellar fracture: - Ortho consulted. - p[ain controlled. - Follow up as an outpatient, unlikely to perform procedure with new LA thrombus and ADDHF.   Code Status: DNR/DNI  Disposition Plan: inpatient   Consultants:  cardiolog  Orthopedics to see  Procedures:  Ct angio    Antibiotics:  Rocephin and azithro single dose on 6.29.2015  HPI/Subjective: Relates still SOB.  Objective: Filed Vitals:   09/22/13 2100 09/22/13 2302 09/22/13 2305 09/23/13 0631  BP: 153/69  142/75 129/86  Pulse: 74  85 74  Temp:   98.3 F (36.8 C) 98.4 F (36.9 C)  TempSrc:   Oral Oral  Resp: 25  18 17   Height:  5\' 3"  (1.6 m)    Weight:  60 kg (132 lb 4.4 oz)  60.5 kg (133 lb 6.1 oz)  SpO2: 93%  95% 95%    Intake/Output Summary (Last 24 hours) at 09/23/13 1133 Last data filed at 09/23/13 0900  Gross per 24 hour  Intake    120 ml  Output   1100 ml  Net   -980 ml   Filed Weights   09/22/13 2302 09/23/13 0631  Weight: 60 kg (132 lb 4.4 oz) 60.5 kg (133 lb 6.1 oz)    Exam:  General: Alert, awake, oriented x3, in no acute distress.  HEENT: No bruits, no goiter. +JVD Heart: Regular rate and rhythm Lungs: Good air movement, crackles B/L Abdomen: Soft, nontender, nondistended, positive  bowel sounds.    Data Reviewed: Basic Metabolic Panel:  Recent Labs Lab 09/22/13 1920 09/23/13 0517  NA 143 142  K 3.4* 3.2*  CL 102 102  CO2 23 25  GLUCOSE 143* 110*  BUN 25* 21  CREATININE 1.09 0.97  CALCIUM 9.1 8.7   Liver Function Tests:  Recent Labs Lab 09/23/13 0517  AST 18  ALT 10  ALKPHOS 101  BILITOT 0.7  PROT 6.0  ALBUMIN 3.2*   No results found for this basename: LIPASE, AMYLASE,  in the last 168 hours No results found for this basename: AMMONIA,  in the last 168 hours CBC:  Recent Labs Lab 09/22/13 1920 09/23/13 0517  WBC 13.3* 9.2  NEUTROABS 11.5* 7.6  HGB 11.2* 9.0*  HCT 33.2* 26.8*  MCV 102.2* 101.5*  PLT 155 118*   Cardiac Enzymes:  Recent Labs Lab 09/22/13 2055 09/23/13 0517  TROPONINI <0.30 <0.30   BNP (last 3 results)  Recent Labs  09/22/13 2055  PROBNP 11659.0*   CBG: No results found for this basename: GLUCAP,  in the last 168 hours  No results found for this or any previous visit (from the past 240 hour(s)).   Studies: Ct Angio Chest Pe W/cm &/or Wo Cm  09/22/2013   CLINICAL DATA:  Hypoxemia.  EXAM: CT ANGIOGRAPHY CHEST WITH CONTRAST  TECHNIQUE: Multidetector CT imaging of the chest was performed  using the standard protocol during bolus administration of intravenous contrast. Multiplanar CT image reconstructions and MIPs were obtained to evaluate the vascular anatomy.  CONTRAST:  100mL OMNIPAQUE IOHEXOL 350 MG/ML IV.  COMPARISON:  Unenhanced CT chest 04/04/2012.  No prior CTA.  FINDINGS: Contrast opacification of the pulmonary arteries is very good. No filling defects within either main pulmonary artery or their branches in either lung to suggest pulmonary embolism. Very large filling defect involving the left atrial appendage extending into the main portion of the left atrium, though this does not obstruct the pulmonary veins as they enter the atrium. Dense mitral annular calcification. Dense calcification involving the  endocardium and myocardium of the left ventricular apex and lateral wall. Moderate aortic valvular calcification. Moderate to severe atherosclerosis involving the thoracic and upper abdominal aorta and their branches without evidence of aneurysm, dissection, or significant stenosis. Replaced proper hepatic artery to the abdominal aorta. Marked right atrial enlargement with reflux of contrast into the intrahepatic IVC and hepatic veins. Severe 3 vessel coronary atherosclerosis. Small pericardial effusion.  Large right pleural effusion and small left pleural effusion. Passive atelectasis in the lower lobes, right greater than left. Mosaic attenuation in both lungs, with scattered areas of hyperlucency. Mild diffuse interstitial pulmonary edema. No confluent airspace consolidation. No pulmonary parenchymal nodules or masses.  Numerous normal-sized lymph nodes in the mediastinum, hila, and axilla. No significant lymphadenopathy. Visualized thyroid gland normal in appearance.  Visualized upper abdomen unremarkable apart from the findings described above. Bone window images demonstrate diffuse thoracic spondylosis, generalized osseous demineralization, and old fractures of the right lateral sixth, seventh and eighth ribs with incomplete union.  Review of the MIP images confirms the above findings.  IMPRESSION: 1. No evidence of pulmonary embolism. 2. Large thrombus involving the left atrial appendage and the main portion of the left atrium. The thrombus does not occlude the pulmonary veins at this time. 3. Old myocardial infarction involving the apex and lateral wall of the left ventricle with dense calcification. 4. Marked right atrial enlargement. Reflux of contrast into the IVC and hepatic veins is consistent with right heart failure and/or tricuspid regurgitation. 5. Severe 3 vessel coronary atherosclerosis. 6. Small pericardial effusion. 7. Large right pleural effusion and small left pleural effusion. 8. Mild CHF,  with mild diffuse interstitial pulmonary edema. These results were called by telephone at the time of interpretation on 09/22/2013 at 9:06 PM to Dr. Rochele RaringKRISTEN WARD , who verbally acknowledged these results.   Electronically Signed   By: Hulan Saashomas  Lawrence M.D.   On: 09/22/2013 21:06   Dg Chest Portable 1 View  09/22/2013   CLINICAL DATA:  Shortness of breath  EXAM: PORTABLE CHEST - 1 VIEW  COMPARISON:  April 27, 2010  FINDINGS: The heart size is enlarged. Mitral annular calcifications are noted. The mediastinum is stable. The aorta is tortuous. There is consolidation of the medial right lung base. There is a small right pleural effusion. There is no pulmonary edema. No acute abnormalities identified within the visualized bones.  IMPRESSION: Right lung base pneumonia with small right pleural effusion.   Electronically Signed   By: Sherian ReinWei-Chen  Lin M.D.   On: 09/22/2013 19:59   Dg Knee Right Port  09/22/2013   CLINICAL DATA:  Larey SeatFell and injured right knee. Anterior and lateral pain and swelling.  EXAM: PORTABLE RIGHT KNEE - 1-2 VIEW  COMPARISON:  None.  FINDINGS: Comminuted fracture involving the patella. No other visible fractures. Mild medial and lateral compartment joint space narrowing. Chondrocalcinosis  involving the lateral and medial menisci. Large joint effusion/hemarthrosis. Osseous demineralization. Femoropopliteal and tibioperoneal atherosclerosis.  IMPRESSION: 1. Comminuted fracture involving the patella. 2. Mild degenerative changes in the medial and lateral compartments secondary to CPPD. 3. Large joint effusion/hemarthrosis.  Sign report   Electronically Signed   By: Hulan Saas M.D.   On: 09/22/2013 20:00    Scheduled Meds: . aspirin EC  81 mg Oral Daily  . atorvastatin  10 mg Oral q1800  . diltiazem  240 mg Oral Daily  . furosemide  40 mg Intravenous Q12H  . heparin  5,000 Units Subcutaneous 3 times per day  . metoprolol  50 mg Oral BID  . [START ON 09/24/2013] pneumococcal 23 valent  vaccine  0.5 mL Intramuscular Tomorrow-1000  . polyethylene glycol  17 g Oral Daily  . potassium chloride  10 mEq Oral Daily   Continuous Infusions:    Marinda Elk  [facility] Pager [phone]. If 8PM-8AM, please contact night-coverage at [email or web address], password [facility] 09/23/2013, 11:33 AM  LOS: 1 day      **Disclaimer: This note may have been dictated with voice recognition software. Similar sounding words can inadvertently be transcribed and this note may contain transcription errors which may not have been corrected upon publication of note.**

## 2013-09-23 NOTE — Consult Note (Signed)
ORTHOPAEDIC CONSULTATION  REQUESTING PHYSICIAN: Charlynne Cousins, MD  Chief Complaint: Right knee injury   HPI: [name] is a 80 y.o. female who complains of  A fall onto the R knee  Past Medical History  Diagnosis Date  . CAD (coronary artery disease)     Non-STEMI January, 2012.. bare-metal stents to ramus and circumflex, 80% small second diagonal to be followed  /   . Aortic stenosis, moderate     Moderate.. echo.. January 2 012; Echocardiogram 10/31/11: Mild LVH, EF 65-70%, moderate AS, mild AI, mean gradient 31, MAC, mild MR, severe LAE, moderate or severe TR, PASP 97.  . Aortic insufficiency     Mild... echo.. January, 2012  . Mitral regurgitation     EF 60%... echo... January, 2012,  mild MR, severe annular calcification  . HTN (hypertension)   . Diverticulosis   . Pulmonary HTN     67 mmHg.. echo.. January, 2012  . Renal arterial thrombosis     January, 2012 while on aspirin and Plavix for coronary stents ( atrial fibrillation) Plavix stopped and Coumadin started  . Renal insufficiency     Creatinine 1.4.. April 29, 2010  . Anemia     Hemoglobin 9.7, February, 2012  . Cirrhosis of liver     appearance On CT, January, 2012  . Drug therapy     Plavix used for bare-metal stent stopped after several weeks when Coumadin and started for renal artery thrombus  . Atrial fibrillation     New diagnosis January, 2012, Coumadin not used initially because of Plavix and aspirin and age  for stents, renal artery thrombus, Plavix stopped Coumadin started April 23, 2010  . Drug therapy     Coumadin.Marland Kitchen started April 23, 2010 after renal artery embolus (atrial fibrillation)  . Renal mass     Low density, upper pole left kidney, CT January 2 012, etiology unclear, MRI can be considered  . Renal artery stenosis     Suspected from abdominal CT January, 2012.. significant  . Lower GI bleed     8/13-required 5 units PRBCs and 3 units FFP; anticoagulation discontinued  .  Anticoagulant-induced bleeding     Anticoagulants to be avoided after August, 2012 because of life-threatening GI bleed  . Ejection fraction     EF 65-70%, echo, August, 2013  . Decreased ambulation status     Unsteady gait, October, 2013   Past Surgical History  Procedure Laterality Date  . Total abdominal hysterectomy    . Dilation and curettage of uterus    . Cardiac catheterization  January 2012    BMS to ramus and LCX; 80% lesion to small D1 followed   History   Social History  . Marital Status: Single    Spouse Name: N/A    Number of Children: N/A  . Years of Education: N/A   Occupational History  . retired    Social History Main Topics  . Smoking status: Never Smoker   . Smokeless tobacco: Never Used  . Alcohol Use: No  . Drug Use: No  . Sexual Activity: No   Other Topics Concern  . None   Social History Narrative  . None   Family History  Problem Relation Age of Onset  . Hypertension     Allergies  Allergen Reactions  . Penicillins Other (See Comments)    unknown  . Sulfa Drugs Cross Reactors Other (See Comments)    Unknown    Prior  to Admission medications   Medication Sig Start Date End Date Taking? Authorizing Provider  ALPRAZolam Duanne Moron) 0.5 MG tablet Take 0.5 mg by mouth 3 (three) times daily as needed.     Yes Historical Provider, MD  aspirin 81 MG tablet Take 81 mg by mouth daily.   Yes Historical Provider, MD  atorvastatin (LIPITOR) 10 MG tablet TAKE 1 TABLET BY MOUTH EVERY DAY 04/01/13  Yes Carlena Bjornstad, MD  cyanocobalamin (,VITAMIN B-12,) 1000 MCG/ML injection Inject 1,000 mcg into the muscle every 30 (thirty) days.   Yes Historical Provider, MD  diltiazem (CARDIZEM CD) 240 MG 24 hr capsule TAKE ONE CAPSULE BY MOUTH EVERY DAY 08/13/13  Yes Carlena Bjornstad, MD  metoprolol (LOPRESSOR) 50 MG tablet TAKE 1 TABLET BY MOUTH TWICE A DAY 03/20/13  Yes Carlena Bjornstad, MD  potassium chloride (K-DUR) 10 MEQ tablet Take 1 tablet (10 mEq total) by mouth  daily. 07/17/13  Yes Carlena Bjornstad, MD  torsemide (DEMADEX) 10 MG tablet Take 1 tablet (10 mg total) by mouth daily. 07/14/13  Yes Carlena Bjornstad, MD  zolpidem (AMBIEN) 5 MG tablet Take 5 mg by mouth at bedtime as needed.     Yes Historical Provider, MD   Ct Angio Chest Pe W/cm &/or Wo Cm  09/22/2013   CLINICAL DATA:  Hypoxemia.  EXAM: CT ANGIOGRAPHY CHEST WITH CONTRAST  TECHNIQUE: Multidetector CT imaging of the chest was performed using the standard protocol during bolus administration of intravenous contrast. Multiplanar CT image reconstructions and MIPs were obtained to evaluate the vascular anatomy.  CONTRAST:  175m OMNIPAQUE IOHEXOL 350 MG/ML IV.  COMPARISON:  Unenhanced CT chest 04/04/2012.  No prior CTA.  FINDINGS: Contrast opacification of the pulmonary arteries is very good. No filling defects within either main pulmonary artery or their branches in either lung to suggest pulmonary embolism. Very large filling defect involving the left atrial appendage extending into the main portion of the left atrium, though this does not obstruct the pulmonary veins as they enter the atrium. Dense mitral annular calcification. Dense calcification involving the endocardium and myocardium of the left ventricular apex and lateral wall. Moderate aortic valvular calcification. Moderate to severe atherosclerosis involving the thoracic and upper abdominal aorta and their branches without evidence of aneurysm, dissection, or significant stenosis. Replaced proper hepatic artery to the abdominal aorta. Marked right atrial enlargement with reflux of contrast into the intrahepatic IVC and hepatic veins. Severe 3 vessel coronary atherosclerosis. Small pericardial effusion.  Large right pleural effusion and small left pleural effusion. Passive atelectasis in the lower lobes, right greater than left. Mosaic attenuation in both lungs, with scattered areas of hyperlucency. Mild diffuse interstitial pulmonary edema. No confluent  airspace consolidation. No pulmonary parenchymal nodules or masses.  Numerous normal-sized lymph nodes in the mediastinum, hila, and axilla. No significant lymphadenopathy. Visualized thyroid gland normal in appearance.  Visualized upper abdomen unremarkable apart from the findings described above. Bone window images demonstrate diffuse thoracic spondylosis, generalized osseous demineralization, and old fractures of the right lateral sixth, seventh and eighth ribs with incomplete union.  Review of the MIP images confirms the above findings.  IMPRESSION: 1. No evidence of pulmonary embolism. 2. Large thrombus involving the left atrial appendage and the main portion of the left atrium. The thrombus does not occlude the pulmonary veins at this time. 3. Old myocardial infarction involving the apex and lateral wall of the left ventricle with dense calcification. 4. Marked right atrial enlargement. Reflux of contrast into the  IVC and hepatic veins is consistent with right heart failure and/or tricuspid regurgitation. 5. Severe 3 vessel coronary atherosclerosis. 6. Small pericardial effusion. 7. Large right pleural effusion and small left pleural effusion. 8. Mild CHF, with mild diffuse interstitial pulmonary edema. These results were called by telephone at the time of interpretation on 09/22/2013 at 9:06 PM to Dr. Pryor Curia , who verbally acknowledged these results.   Electronically Signed   By: Evangeline Dakin M.D.   On: 09/22/2013 21:06   Dg Chest Portable 1 View  09/22/2013   CLINICAL DATA:  Shortness of breath  EXAM: PORTABLE CHEST - 1 VIEW  COMPARISON:  April 27, 2010  FINDINGS: The heart size is enlarged. Mitral annular calcifications are noted. The mediastinum is stable. The aorta is tortuous. There is consolidation of the medial right lung base. There is a small right pleural effusion. There is no pulmonary edema. No acute abnormalities identified within the visualized bones.  IMPRESSION: Right lung base  pneumonia with small right pleural effusion.   Electronically Signed   By: Abelardo Diesel M.D.   On: 09/22/2013 19:59   Dg Knee Right Port  09/22/2013   CLINICAL DATA:  [place] and injured right knee. Anterior and lateral pain and swelling.  EXAM: PORTABLE RIGHT KNEE - 1-2 VIEW  COMPARISON:  None.  FINDINGS: Comminuted fracture involving the patella. No other visible fractures. Mild medial and lateral compartment joint space narrowing. Chondrocalcinosis involving the lateral and medial menisci. Large joint effusion/hemarthrosis. Osseous demineralization. Femoropopliteal and tibioperoneal atherosclerosis.  IMPRESSION: 1. Comminuted fracture involving the patella. 2. Mild degenerative changes in the medial and lateral compartments secondary to CPPD. 3. Large joint effusion/hemarthrosis.  Sign report   Electronically Signed   By: Evangeline Dakin M.D.   On: 09/22/2013 20:00    Positive ROS: All other systems have been reviewed and were otherwise negative with the exception of those mentioned in the HPI and as above.  Labs cbc  Recent Labs  09/22/13 1920 09/23/13 0517  WBC 13.3* 9.2  HGB 11.2* 9.0*  HCT 33.2* 26.8*  PLT 155 118*    Labs inflam No results found for this basename: ESR, CRP,  in the last 72 hours  Labs coag  Recent Labs  09/22/13 1920  INR 1.40     Recent Labs  09/22/13 1920 09/23/13 0517  NA 143 142  K 3.4* 3.2*  CL 102 102  CO2 23 25  GLUCOSE 143* 110*  BUN 25* 21  CREATININE 1.09 0.97  CALCIUM 9.1 8.7    Physical Exam: Filed Vitals:   09/23/13 0631  BP: 129/86  Pulse: 74  Temp: 98.4 F (36.9 C)  Resp: 17   General: Alert, no acute distress Cardiovascular: No pedal edema Respiratory: No cyanosis, no use of accessory musculature GI: No organomegaly, abdomen is soft and non-tender Skin: No lesions in the area of chief complaint Neurologic: Sensation intact distally Psychiatric: Patient is competent for consent with normal mood and affect Lymphatic:  No axillary or cervical lymphadenopathy  MUSCULOSKELETAL:  RLE: mild echymosis, and tenderness at patella, distally NVI, compartments soft LUE: SILT M/R/U nerve, 2+ radial pulse, +EPL/FPL/IO Compartments soft Painless ROM No Crepitous  Other extremities are atraumatic with painless ROM and NVI.  Assessment: R non-displaced patella fracture  Plan: Non-operative management Knee immobilizer full time Weight Bearing Status: WBAT in knee immobilizer PT VTE px: SCD's and chemical per the primary team  OK for d/c from ortho standpoint once clears PT/OT/cardiac w/u  F/u  with me in the office in [place], D, MD Cell [phone]   09/23/2013 8:32 AM

## 2013-09-23 NOTE — Progress Notes (Signed)
Echocardiogram 2D Echocardiogram has been performed.  [name], Kimberly 09/23/2013, 10:55 AM

## 2013-09-23 NOTE — Progress Notes (Signed)
Patient yelling out loud the name of her home sitter, "Teryl LucyMaxine!" Was able to get Maxine's phone number for patient to talk to her and she felt better. Patient also able to talk to nephew and she felt better also. Patient complained of pain and was very anxious and was trying to get out of bed with knee immobilizer on prior to speaking with friends and family. PRN pain med and anti-anxiety med given. Will continue to monitor patient to end of shift.

## 2013-09-23 NOTE — Consult Note (Signed)
CARDIOLOGY CONSULT NOTE   Patient ID: [name] MRN: [id number], DOB/AGE: November 26, 1918   Admit date: 09/22/2013 Date of Consult: 09/23/2013   Primary Physician: Abigail [name],Kimberly EDWARD, MD Primary Cardiologist: Dr. Myrtis SerKatz  Pt. Profile  80 year old woman admitted after he fell at home with injury to her right knee.  Problem List  Past Medical History  Diagnosis Date  . CAD (coronary artery disease)     Non-STEMI January, 2012.. bare-metal stents to ramus and circumflex, 80% small second diagonal to be followed  /   . Aortic stenosis, moderate     Moderate.. echo.. January 2 012; Echocardiogram 10/31/11: Mild LVH, EF 65-70%, moderate AS, mild AI, mean gradient 31, MAC, mild MR, severe LAE, moderate or severe TR, PASP 97.  . Aortic insufficiency     Mild... echo.. January, 2012  . Mitral regurgitation     EF 60%... echo... January, 2012,  mild MR, severe annular calcification  . HTN (hypertension)   . Diverticulosis   . Pulmonary HTN     67 mmHg.. echo.. January, 2012  . Renal arterial thrombosis     January, 2012 while on aspirin and Plavix for coronary stents ( atrial fibrillation) Plavix stopped and Coumadin started  . Renal insufficiency     Creatinine 1.4.. April 29, 2010  . Anemia     Hemoglobin 9.7, February, 2012  . Cirrhosis of liver     appearance On CT, January, 2012  . Drug therapy     Plavix used for bare-metal stent stopped after several weeks when Coumadin and started for renal artery thrombus  . Atrial fibrillation     New diagnosis January, 2012, Coumadin not used initially because of Plavix and aspirin and age  for stents, renal artery thrombus, Plavix stopped Coumadin started April 23, 2010  . Drug therapy     Coumadin.Marland Kitchen. started April 23, 2010 after renal artery embolus (atrial fibrillation)  . Renal mass     Low density, upper pole left kidney, CT January 2 012, etiology unclear, MRI can be considered  . Renal artery stenosis     Suspected from  abdominal CT January, 2012.. significant  . Lower GI bleed     8/13-required 5 units PRBCs and 3 units FFP; anticoagulation discontinued  . Anticoagulant-induced bleeding     Anticoagulants to be avoided after August, 2012 because of life-threatening GI bleed  . Ejection fraction     EF 65-70%, echo, August, 2013  . Decreased ambulation status     Unsteady gait, October, 2013    Past Surgical History  Procedure Laterality Date  . Total abdominal hysterectomy    . Dilation and curettage of uterus    . Cardiac catheterization  January 2012    BMS to ramus and LCX; 80% lesion to small D1 followed     Allergies  Allergies  Allergen Reactions  . Penicillins Other (See Comments)    unknown  . Sulfa Drugs Cross Reactors Other (See Comments)    Unknown     HPI   This 80 year old woman was admitted after a fall at home.  She suffered a comminuted fracture of her right patella.  Initial workup in the emergency room included an elevated d-dimer which led to a CT angiogram of the chest.  This demonstrates a large left atrial thrombus which occupies a large part of the left atrium.  The patient has a long history of atrial fibrillation.  She previously had been on Xarelto but had a GI bleed  requiring 5 units of blood in August 2013 after which further anticoagulation was stopped except for taking a baby aspirin daily.  The patient also has a history of coronary artery disease with stents.  She had a bare-metal stent to the ramus and circumflex on April 04, 2010.  She was also found to have a residual 80% stenosis of a small second diagonal that is to be followed medically.  She was treated for a month with aspirin and Plavix and then the plan was to switch her to low-dose aspirin with Coumadin very tightly controlled.  While on dual antiplatelet therapy the patient was readmitted on 04/23/10 with right flank pain which was secondary to an embolus to the right renal artery.  She was started on  Coumadin and baby aspirin, Plavix having been stopped.  At discharge her hemoglobin was 9.7 and fecal Hemoccult testing was negative.  She took warfarin until 10/27/11 when she requested being taken off so that she could eat more vegetables and she was changed to Xarelto at that time.  She was admitted to the hospital on 10/31/11 with bright red bleeding per rectum.  This had been preceded by some constipation for which she had seen Dr. Rhea Belton on 10/18/11.  She required 5 units of packed cells during that admission.  It was felt that she should not have anticoagulation going forward because of the life-threatening GI bleed that she had experienced while on Xarelto. The patient reports that she has not been aware of any recent new GI symptoms, specifically has not been aware of any blood in her stool.  She has not had any TIA symptoms.  She denies any chest pain or angina.  She has had some increasing shortness of breath. She has a history of known valvular heart disease with moderate aortic stenosis and mild mitral regurgitation as well as severe tricuspid regurgitation and by previous echocardiogram on 11/02/11 severe pulmonary hypertension with a peak PA pressure of 97.  Her left atrium was severely dilated the right atrium moderately dilated. The patient has been on aspirin 81 mg daily at home.  Inpatient Medications  . aspirin EC  81 mg Oral Daily  . atorvastatin  10 mg Oral q1800  . diltiazem  240 mg Oral Daily  . heparin  5,000 Units Subcutaneous 3 times per day  . levofloxacin (LEVAQUIN) IV  750 mg Intravenous Q48H  . metoprolol  50 mg Oral BID  . [START ON 09/24/2013] pneumococcal 23 valent vaccine  0.5 mL Intramuscular Tomorrow-1000  . potassium chloride  10 mEq Oral Daily    Family History Family History  Problem Relation Age of Onset  . Hypertension       Social History History   Social History  . Marital Status: Single    Spouse Name: N/A    Number of Children: N/A  . Years of  Education: N/A   Occupational History  . retired    Social History Main Topics  . Smoking status: Never Smoker   . Smokeless tobacco: Never Used  . Alcohol Use: No  . Drug Use: No  . Sexual Activity: No   Other Topics Concern  . Not on file   Social History Narrative  . No narrative on file     Review of Systems  General:  No chills, fever, night sweats or weight changes.  Cardiovascular:  No chest pain, dyspnea on exertion, edema, orthopnea, palpitations, paroxysmal nocturnal dyspnea. Dermatological: No rash, lesions/masses Respiratory: No cough, dyspnea Urologic:  No hematuria, dysuria Abdominal:   No nausea, vomiting, diarrhea, bright red blood per rectum, melena, or hematemesis Neurologic:  No visual changes, wkns, changes in mental status. All other systems reviewed and are otherwise negative except as noted above.  Physical Exam  Blood pressure 129/86, pulse 74, temperature 98.4 F (36.9 C), temperature source Oral, resp. rate 17, height 5\' 3"  (1.6 m), weight 133 lb 6.1 oz (60.5 kg), SpO2 95.00%.  General: Pleasant, NAD.  Complains of pain in her low back and in her right knee Psych: Normal affect. Neuro: Alert and oriented X 3. Moves all extremities spontaneously. HEENT: Normal  Neck: The jugular venous pressure is elevated. Lungs:  Minimal basilar rales.  No wheezing. Heart: Grade 2/6 systolic murmur at base and apex.  No diastolic murmur. Abdomen: Soft, non-tender, non-distended, BS + x 4.  Extremities: No clubbing, cyanosis or edema. DP/PT/Radials 2+ and equal bilaterally.  Labs   Recent Labs  09/22/13 2055 09/23/13 0517  TROPONINI <0.30 <0.30   Lab Results  Component Value Date   WBC 9.2 09/23/2013   HGB 9.0* 09/23/2013   HCT 26.8* 09/23/2013   MCV 101.5* 09/23/2013   PLT 118* 09/23/2013    Recent Labs Lab 09/23/13 0517  NA 142  K 3.2*  CL 102  CO2 25  BUN 21  CREATININE 0.97  CALCIUM 8.7  PROT 6.0  BILITOT 0.7  ALKPHOS 101  ALT 10  AST  18  GLUCOSE 110*   No results found for this basename: CHOL, HDL, LDLCALC, TRIG   Lab Results  Component Value Date   DDIMER  Value: 1.88        AT THE INHOUSE ESTABLISHED CUTOFF VALUE OF 0.48 ug/mL FEU, THIS ASSAY HAS BEEN DOCUMENTED IN THE LITERATURE TO HAVE A SENSITIVITY AND NEGATIVE PREDICTIVE VALUE OF AT LEAST 98 TO 99%.  THE TEST RESULT SHOULD BE CORRELATED WITH AN ASSESSMENT OF THE CLINICAL PROBABILITY OF DVT / VTE.* 04/23/2010    Radiology/Studies  Ct Angio Chest Pe W/cm &/or Wo Cm  09/22/2013   CLINICAL DATA:  Hypoxemia.  EXAM: CT ANGIOGRAPHY CHEST WITH CONTRAST  TECHNIQUE: Multidetector CT imaging of the chest was performed using the standard protocol during bolus administration of intravenous contrast. Multiplanar CT image reconstructions and MIPs were obtained to evaluate the vascular anatomy.  CONTRAST:  OMNIPAQUE IOHEXOL 350 MG/ML IV.  COMPARISON:  Unenhanced CT chest 04/04/2012.  No prior CTA.  FINDINGS: Contrast opacification of the pulmonary arteries is very good. No filling defects within either main pulmonary artery or their branches in either lung to suggest pulmonary embolism. Very large filling defect involving the left atrial appendage extending into the main portion of the left atrium, though this does not obstruct the pulmonary veins as they enter the atrium. Dense mitral annular calcification. Dense calcification involving the endocardium and myocardium of the left ventricular apex and lateral wall. Moderate aortic valvular calcification. Moderate to severe atherosclerosis involving the thoracic and upper abdominal aorta and their branches without evidence of aneurysm, dissection, or significant stenosis. Replaced proper hepatic artery to the abdominal aorta. Marked right atrial enlargement with reflux of contrast into the intrahepatic IVC and hepatic veins. Severe 3 vessel coronary atherosclerosis. Small pericardial effusion.  Large right pleural effusion and small left  pleural effusion. Passive atelectasis in the lower lobes, right greater than left. Mosaic attenuation in both lungs, with scattered areas of hyperlucency. Mild diffuse interstitial pulmonary edema. No confluent airspace consolidation. No pulmonary parenchymal nodules or masses.  Numerous  normal-sized lymph nodes in the mediastinum, hila, and axilla. No significant lymphadenopathy. Visualized thyroid gland normal in appearance.  Visualized upper abdomen unremarkable apart from the findings described above. Bone window images demonstrate diffuse thoracic spondylosis, generalized osseous demineralization, and old fractures of the right lateral sixth, seventh and eighth ribs with incomplete union.  Review of the MIP images confirms the above findings.  IMPRESSION: 1. No evidence of pulmonary embolism. 2. Large thrombus involving the left atrial appendage and the main portion of the left atrium. The thrombus does not occlude the pulmonary veins at this time. 3. Old myocardial infarction involving the apex and lateral wall of the left ventricle with dense calcification. 4. Marked right atrial enlargement. Reflux of contrast into the IVC and hepatic veins is consistent with right heart failure and/or tricuspid regurgitation. 5. Severe 3 vessel coronary atherosclerosis. 6. Small pericardial effusion. 7. Large right pleural effusion and small left pleural effusion. 8. Mild CHF, with mild diffuse interstitial pulmonary edema. These results were called by telephone at the time of interpretation on 09/22/2013 at 9:06 PM to Dr. Rochele Raring , who verbally acknowledged these results.   Electronically Signed   By: Hulan Saas M.D.   On: 09/22/2013 21:06   Dg Chest Portable 1 View  09/22/2013   CLINICAL DATA:  Shortness of breath  EXAM: PORTABLE CHEST - 1 VIEW  COMPARISON:  April 27, 2010  FINDINGS: The heart size is enlarged. Mitral annular calcifications are noted. The mediastinum is stable. The aorta is tortuous. There  is consolidation of the medial right lung base. There is a small right pleural effusion. There is no pulmonary edema. No acute abnormalities identified within the visualized bones.  IMPRESSION: Right lung base pneumonia with small right pleural effusion.   Electronically Signed   By: Sherian Rein M.D.   On: 09/22/2013 19:59   Dg Knee Right Port  09/22/2013   CLINICAL DATA:  Larey Seat and injured right knee. Anterior and lateral pain and swelling.  EXAM: PORTABLE RIGHT KNEE - 1-2 VIEW  COMPARISON:  None.  FINDINGS: Comminuted fracture involving the patella. No other visible fractures. Mild medial and lateral compartment joint space narrowing. Chondrocalcinosis involving the lateral and medial menisci. Large joint effusion/hemarthrosis. Osseous demineralization. Femoropopliteal and tibioperoneal atherosclerosis.  IMPRESSION: 1. Comminuted fracture involving the patella. 2. Mild degenerative changes in the medial and lateral compartments secondary to CPPD. 3. Large joint effusion/hemarthrosis.  Sign report   Electronically Signed   By: Hulan Saas M.D.   On: 09/22/2013 20:00    ECG  Not yet done  ASSESSMENT AND PLAN 1. chronic atrial fibrillation with new finding of large left atrial thrombus 2. prior history of renal artery embolus to  right kidney presumably from left atrium in January 2012. 3. history of life-threatening GI bleed while on Xarelto in August 2013.  Anticoagulation except baby aspirin was stopped at that time. 4. Anemia 5. Hypokalemia 6. diastolic heart failure, acute on chronic 7. multi-valvular heart disease with severe aortic stenosis, mild mitral regurgitation, and severe tricuspid regurgitation and severe pulmonary hypertension.  Echocardiogram to be updated. 8. comminuted fracture of patella  Plan: Ideally this patient should be on anticoagulation for left atrial thrombus.  However this recommendation is tempered by several factors including past history of life-threatening  GI bleed and the fact that she is currently anemic.  She also has a new comminuted fracture of patella.  I would defer any final decision concerning long-term anticoagulation until we are sure that she  is not currently losing blood.  She will need serial hemoglobins and stool Hemoccults.  She will also need orthopedic consultation regarding her knee problem.  I would also like Dr. Jerral BonitoJeff Katz input since he knows her well and has seen her through her previous problems with anticoagulation.  Continue daily aspirin for now.  Signed, Cassell Clementhomas Brackbill, MD  09/23/2013, 9:18 AM

## 2013-09-23 NOTE — Progress Notes (Signed)
PT Cancellation Note  Patient Details Name: [name] MRN: [id number] DOB: May 28, 1918   Cancelled Treatment:    Reason Eval/Treat Not Completed: Medical issues which prohibited therapy; chart reviewed.  Noted awaiting ortho consult due to patellar fracture.  Will await recommendations prior to PT eval.  Thanks   [facility] Annex[facility]YNN,CYNDI 09/23/2013, 8:54 AM

## 2013-09-24 DIAGNOSIS — S8290XD Unspecified fracture of unspecified lower leg, subsequent encounter for closed fracture with routine healing: Secondary | ICD-10-CM

## 2013-09-24 LAB — BASIC METABOLIC PANEL
BUN: 24 mg/dL — ABNORMAL HIGH (ref 6–23)
CALCIUM: 8.7 mg/dL (ref 8.4–10.5)
CO2: 27 meq/L (ref 19–32)
Chloride: 104 mEq/L (ref 96–112)
Creatinine, Ser: 1.22 mg/dL — ABNORMAL HIGH (ref 0.50–1.10)
GFR calc Af Amer: 43 mL/min — ABNORMAL LOW (ref >90)
GFR, EST NON AFRICAN AMERICAN: 37 mL/min — AB (ref >90)
GLUCOSE: 93 mg/dL (ref 70–99)
Potassium: 4.1 mEq/L (ref 3.7–5.3)
Sodium: 144 mEq/L (ref 137–147)

## 2013-09-24 LAB — LEGIONELLA ANTIGEN, URINE: Legionella Antigen, Urine: NEGATIVE

## 2013-09-24 MED ORDER — ENSURE PUDDING PO PUDG
1.0000 | ORAL | Status: DC
  Administered 2013-09-25: 1 via ORAL

## 2013-09-24 MED ORDER — TORSEMIDE 20 MG PO TABS
20.0000 mg | ORAL_TABLET | Freq: Every day | ORAL | Status: DC
  Administered 2013-09-25: 20 mg via ORAL
  Filled 2013-09-24: qty 1

## 2013-09-24 NOTE — Evaluation (Signed)
Occupational Therapy Evaluation Patient Details Name: [name] MRN: [id number] DOB: 16-Feb-1919 Today's Date: 09/24/2013    History of Present Illness 80 y.o. female with Past medical history of Simonne ComeLeo is a school aortic stenosis, mitral regurgitation, hypertension,  GI bleed, atrial fibrillation not on anticoagulation due to GI bleed, pulmonary hypertension. Pt suffered Rt Patella fx from fall; no surgery indicated per Ortho NOte.    Clinical Impression   Pt has 24/7 (A) at home and caregiver present agreeable to (A) at this level. Pt with cognitive deficits at baseline and requires (A). OT to follow acutely for balance with adls. PTA pt could transfers S level.  Addendum- pt is now SNF level after speaking with PT [place]. Family and caregivers now feel SNF is most appropriate d/c location    Follow Up Recommendations  SNF    Equipment Recommendations  Other (comment) (defer SNF)    Recommendations for Other Services       Precautions / Restrictions Precautions Precautions: Fall Required Braces or Orthoses: Knee Immobilizer - Right Knee Immobilizer - Right: On at all times Restrictions RLE Weight Bearing: Weight bearing as tolerated      Mobility Bed Mobility               General bed mobility comments: in chair on arrival  Transfers Overall transfer level: Needs assistance Equipment used: Rolling walker (2 wheeled) Transfers: Sit to/from Stand Sit to Stand: Mod assist         General transfer comment: max cues and posterior lean    Balance Overall balance assessment: History of Falls Sitting-balance support: Feet supported;Bilateral upper extremity supported Sitting balance-Leahy Scale: Fair     Standing balance support: Bilateral upper extremity supported;During functional activity Standing balance-Leahy Scale: Poor                              ADL Overall ADL's : Needs assistance/impaired     Grooming: Moderate  assistance;Standing Grooming Details (indicate cue type and reason): pt requires support to use bil UE to wash hands. Pt needed (A) to reach outside base of support                  Toilet Transfer: Moderate assistance;RW;BSC Toilet Transfer Details (indicate cue type and reason): max cueing and (A) to anterior weight shift         Functional mobility during ADLs: Moderate assistance General ADL Comments: PT sitting in chair on arrival. Caregiver Maxine (A) pt with sit<>Stand. pt with posterior lean and required therapist (A) to static stand. Caregiver attempting to (A) pt with mobility and pt required max cueing. OT helping educate on sequence with RW to maximize pt's ability to ambulate. Caregiver reports being able to help at this level. Niece and son in law present during eval. Pt and caregiver advised no showering until home health arrives to assess shower transfers.     Vision                     Perception     Praxis      Pertinent Vitals/Pain C/o pain at Rt knee Ice applied to knee at end of session     Hand Dominance Right   Extremity/Trunk Assessment Upper Extremity Assessment Upper Extremity Assessment: Overall WFL for tasks assessed   Lower Extremity Assessment Lower Extremity Assessment: Defer to PT evaluation   Cervical / Trunk Assessment Cervical / Trunk Assessment: Kyphotic  Communication Communication Communication: HOH   Cognition Arousal/Alertness: Awake/alert Behavior During Therapy: Anxious Overall Cognitive Status: History of cognitive impairments - at baseline                 General Comments: pt currently at baseline per caregiver.    General Comments       Exercises       Shoulder Instructions      Home Living Family/patient expects to be discharged to:: Private residence Living Arrangements: Non-relatives/Friends Available Help at Discharge: Personal care attendant;Available 24 hours/day Type of Home:  House Home Access: Ramped entrance     Home Layout: One level     Bathroom Shower/Tub: Producer, television/film/videoWalk-in shower   Bathroom Toilet: Standard     Home Equipment: Emergency planning/management officerhower seat;Walker - 2 wheels;Cane - single point;Grab bars - tub/shower;Wheelchair - manual   Additional Comments: house is wheelchair accessible       Prior Functioning/Environment Level of Independence: Needs assistance  Gait / Transfers Assistance Needed: pt ambulated with SPC PRN but sitter reports predominately ambulates without AD ADL's / Homemaking Assistance Needed: sitters cook and are in charge of medications   Comments: has LPN 8-4; CNA's [id number]-8am    OT Diagnosis: Generalized weakness;Acute pain   OT Problem List: Decreased strength;Decreased activity tolerance;Impaired balance (sitting and/or standing);Decreased safety awareness;Decreased knowledge of use of DME or AE;Decreased knowledge of precautions;Pain   OT Treatment/Interventions: Self-care/ADL training;Therapeutic exercise;DME and/or AE instruction;Therapeutic activities;Patient/family education;Balance training    OT Goals(Current goals can be found in the care plan section) Acute Rehab OT Goals Patient Stated Goal: to be walking better than this OT Goal Formulation: Patient unable to participate in goal setting Time For Goal Achievement: 10/08/13 Potential to Achieve Goals: Good  OT Frequency: Min 2X/week   Barriers to D/C:            Co-evaluation              End of Session Equipment Utilized During Treatment: Rolling walker Nurse Communication: Mobility status;Precautions  Activity Tolerance: Patient tolerated treatment well Patient left: in chair;with call bell/phone within reach;with family/visitor present   Time: [id number] OT Time Calculation (min): 22 min Charges:  OT General Charges $OT Visit: 1 Procedure OT Evaluation $Initial OT Evaluation Tier I: 1 Procedure OT Treatments $Self Care/Home Management : 8-22 mins G-Codes:     Harolyn RutherfordJones, Mikle Sternberg B 09/24/2013, 3:09 PM Pager: [phone]

## 2013-09-24 NOTE — Progress Notes (Signed)
INITIAL NUTRITION ASSESSMENT  DOCUMENTATION CODES Per approved criteria  -Not Applicable   INTERVENTION: Provide Ensure Pudding once daily Provide Magic Cup ice cream once daily Encourage PO intake  NUTRITION DIAGNOSIS: Inadequate oral intake related to poor appetite as evidenced by pt's report/13% weight loss.   Goal: Pt to meet >/= 90% of their estimated nutrition needs   Monitor:  PO intake, weight trend, labs  Reason for Assessment: Consult (weight loss)  80 y.o. female  Admitting Dx: Acute diastolic heart failure  ASSESSMENT: [age] y.o. female with Past medical history of aortic stenosis, mitral regurgitation, hypertension, GI bleed, atrial fibrillation not on anticoagulation due to GI bleed, pulmonary hypertension. Patient presented with complaints of shortness of breath. She mentions this has been an ongoing issue since last a few days.  Pt reports that she used to way 155 lbs and she has gradually lost weight over a few years due to decreased appetite. Pt states that she doesn't eat breakfast, eats a half sandwich for lunch, and eats a full dinner. Today pt ate 25% of breakfast and 50% of lunch. Encouraged PO intake to prevent further weight loss; discussed ways to add calories/protein. Pt is not a fan of Ensure/Boost but she is interested in trying Ensure pudding and magic cup ice cream.   Height: Ht Readings from Last 1 Encounters:  09/22/13 5\' 3"  (1.6 m)    Weight: Wt Readings from Last 1 Encounters:  09/24/13 134 lb 3.2 oz (60.873 kg)    Ideal Body Weight: 115 lbs  % Ideal Body Weight: 116%  Wt Readings from Last 10 Encounters:  09/24/13 134 lb 3.2 oz (60.873 kg)  07/14/13 125 lb 1.9 oz (56.754 kg)  07/11/12 125 lb (56.7 kg)  01/10/12 145 lb (65.772 kg)  11/21/11 138 lb 12.8 oz (62.959 kg)  11/15/11 139 lb 3.2 oz (63.141 kg)  11/04/11 145 lb 15.1 oz (66.2 kg)  10/18/11 143 lb (64.864 kg)  05/01/11 149 lb (67.586 kg)  09/23/10 151 lb (68.493 kg)     Usual Body Weight: 155 lbs  % Usual Body Weight: 86.5%  BMI:  Body mass index is 23.78 kg/(m^2).  Estimated Nutritional Needs: Kcal: 1400-1600 Protein: 60-70 grams Fluid: 1.4-1.6 L/day  Skin: intact; non-pitting RLE and LLE edema  Diet Order: Cardiac  EDUCATION NEEDS: -No education needs identified at this time   Intake/Output Summary (Last 24 hours) at 09/24/13 1707 Last data filed at 09/24/13 1627  Gross per 24 hour  Intake    600 ml  Output   1600 ml  Net  -1000 ml    Last BM: 6/29  Labs:   Recent Labs Lab 09/22/13 1920 09/23/13 0517 09/24/13 0420  NA 143 142 144  K 3.4* 3.2* 4.1  CL 102 102 104  CO2 23 25 27   BUN 25* 21 24*  CREATININE 1.09 0.97 1.22*  CALCIUM 9.1 8.7 8.7  GLUCOSE 143* 110* 93    CBG (last 3)  No results found for this basename: GLUCAP,  in the last 72 hours  Scheduled Meds: . aspirin EC  81 mg Oral Daily  . atorvastatin  10 mg Oral q1800  . diltiazem  240 mg Oral Daily  . furosemide  40 mg Intravenous Q12H  . heparin  5,000 Units Subcutaneous 3 times per day  . metoprolol  50 mg Oral BID  . polyethylene glycol  17 g Oral Daily  . potassium chloride  10 mEq Oral Daily  . [START ON 09/25/2013] torsemide  20 mg Oral Daily    Continuous Infusions:   Past Medical History  Diagnosis Date  . CAD (coronary artery disease)     Non-STEMI January, 2012.. bare-metal stents to ramus and circumflex, 80% small second diagonal to be followed  /   . Aortic stenosis, moderate     Moderate.. echo.. January 2 012; Echocardiogram 10/31/11: Mild LVH, EF 65-70%, moderate AS, mild AI, mean gradient 31, MAC, mild MR, severe LAE, moderate or severe TR, PASP 97.  . Aortic insufficiency     Mild... echo.. January, 2012  . Mitral regurgitation     EF 60%... echo... January, 2012,  mild MR, severe annular calcification  . HTN (hypertension)   . Diverticulosis   . Pulmonary HTN     67 mmHg.. echo.. January, 2012  . Renal arterial thrombosis      January, 2012 while on aspirin and Plavix for coronary stents ( atrial fibrillation) Plavix stopped and Coumadin started  . Renal insufficiency     Creatinine 1.4.. April 29, 2010  . Anemia     Hemoglobin 9.7, February, 2012  . Cirrhosis of liver     appearance On CT, January, 2012  . Drug therapy     Plavix used for bare-metal stent stopped after several weeks when Coumadin and started for renal artery thrombus  . Atrial fibrillation     New diagnosis January, 2012, Coumadin not used initially because of Plavix and aspirin and age  for stents, renal artery thrombus, Plavix stopped Coumadin started April 23, 2010  . Drug therapy     Coumadin.Marland Kitchen. started April 23, 2010 after renal artery embolus (atrial fibrillation)  . Renal mass     Low density, upper pole left kidney, CT January 2 012, etiology unclear, MRI can be considered  . Renal artery stenosis     Suspected from abdominal CT January, 2012.. significant  . Lower GI bleed     8/13-required 5 units PRBCs and 3 units FFP; anticoagulation discontinued  . Anticoagulant-induced bleeding     Anticoagulants to be avoided after August, 2012 because of life-threatening GI bleed  . Ejection fraction     EF 65-70%, echo, August, 2013  . Decreased ambulation status     Unsteady gait, October, 2013    Past Surgical History  Procedure Laterality Date  . Total abdominal hysterectomy    . Dilation and curettage of uterus    . Cardiac catheterization  January 2012    BMS to ramus and LCX; 80% lesion to small D1 followed    Ian Malkineanne Barnett RD, LDN Inpatient Clinical Dietitian Pager: [phone] After Hours Pager: [phone]

## 2013-09-24 NOTE — Plan of Care (Signed)
Problem: Consults Goal: Nutrition Consult-if indicated Outcome: Progressing Dietician consult ordered

## 2013-09-24 NOTE — Evaluation (Addendum)
Physical Therapy Evaluation Patient Details Name: Berna Buella Mae Kittel MRN: [id number] DOB: [birth year] Today's Date: 09/24/2013   History of Present Illness  80 y.o. female with Past medical history of Simonne ComeLeo is a school aortic stenosis, mitral regurgitation, hypertension,  GI bleed, atrial fibrillation not on anticoagulation due to GI bleed, pulmonary hypertension. Pt suffered Rt Patella fx from fall; no surgery indicated per Ortho NOte.   Clinical Impression  Pt adm due to the above. Presents with decreased independence with functional mobility secondary to deficits indicated below. Pt to benefit from skilled acute PT to address deficits and maximize functional mobility prior to D/C home. Pt does have caregivers 24/7 upon acute D/C. Discussed with caregiver need for physical (A) for all mobility and transfers at this time and that pt is a high fall risk. Recommend use of gt belt by caregivers for (A) and adamantly recommend pt uses RW upon acute D/C for mobility. May require use of her wheelchair for increased mobility distances due to pain.    Spoke later in the afternoon with MD; caregiver and family are now stating 24/7 physical (A) cannot be adequately provided upon D/C and D/C disposition is now updated to SNF for continued post acute rehab; prior to returning back home with caregivers.     Follow Up Recommendations SNF;Supervision/Assistance - 24 hour    Equipment Recommendations  3in1 (PT)    Recommendations for Other Services OT consult     Precautions / Restrictions Precautions Precautions: Fall Required Braces or Orthoses: Knee Immobilizer - Right Knee Immobilizer - Right: On at all times Restrictions Weight Bearing Restrictions: Yes RLE Weight Bearing: Weight bearing as tolerated      Mobility  Bed Mobility Overal bed mobility: Needs Assistance Bed Mobility: Supine to Sit     Supine to sit: Min assist;HOB elevated     General bed mobility comments: (A) and max encouragement to  advance Rt LE to/off EOB; incr time due to pain; caregiver present for education on proper technique to (A); cues for hand placement and sequencing   Transfers Overall transfer level: Needs assistance Equipment used: Rolling walker (2 wheeled) Transfers: Sit to/from Stand Sit to Stand: Min assist         General transfer comment: max multimodal cues for hand placement and safety with RW; (A) to maintain balance due to difficulty weightbearing through Rt LE; cues for upright posture; educated caregiver on (A) technique with gt belt   Ambulation/Gait Ambulation/Gait assistance: Min assist Ambulation Distance (Feet): 10 Feet (715ft fwd 5 ft backwards ) Assistive device: Rolling walker (2 wheeled) Gait Pattern/deviations: Step-to pattern;Decreased stance time - right;Decreased step length - left;Narrow base of support;Trunk flexed;Antalgic Gait velocity: guarded due to pain Gait velocity interpretation: Below normal speed for age/gender General Gait Details: cues for upright posture and gt sequencing with safe technique of RW; max multimodal cues throughout session; while educating caregiver on guarding/(A) technique  Stairs            Wheelchair Mobility    Modified Rankin (Stroke Patients Only)       Balance Overall balance assessment: History of Falls;Needs assistance Sitting-balance support: No upper extremity supported;Feet supported Sitting balance-Leahy Scale: Fair Sitting balance - Comments: tolerated sitting EOB 5 min; no c/o dizziness   Standing balance support: During functional activity;Bilateral upper extremity supported Standing balance-Leahy Scale: Poor Standing balance comment: min (A) and use of RW for balance; denies any dizziness  Pertinent Vitals/Pain C/o 8/10 after activity; patient repositioned for comfort     Home Living Family/patient expects to be discharged to:: Private residence Living Arrangements:  Non-relatives/Friends (24/7 aides ) Available Help at Discharge: Personal care attendant;Available 24 hours/day Type of Home: House Home Access: Ramped entrance     Home Layout: One level Home Equipment: Emergency planning/management officerhower seat;Walker - 2 wheels;Cane - single point;Grab bars - tub/shower;Wheelchair - manual Additional Comments: house is wheelchair accessible     Prior Function Level of Independence: Needs assistance   Gait / Transfers Assistance Needed: pt ambulated with SPC PRN but sitter reports predominately ambulates without AD  ADL's / Homemaking Assistance Needed: sitters cook and are in charge of medications  Comments: has LPN 8-4; CNA's [id number]-8am     Hand Dominance        Extremity/Trunk Assessment   Upper Extremity Assessment: Defer to OT evaluation           Lower Extremity Assessment: RLE deficits/detail      Cervical / Trunk Assessment: Kyphotic  Communication   Communication: HOH  Cognition Arousal/Alertness: Awake/alert Behavior During Therapy: Anxious Overall Cognitive Status: History of cognitive impairments - at baseline Area of Impairment: Safety/judgement;Problem solving     Memory: Decreased recall of precautions   Safety/Judgement: Decreased awareness of deficits;Decreased awareness of safety   Problem Solving: Difficulty sequencing;Requires verbal cues;Requires tactile cues General Comments: caregiver reports pt is at baseline from cognition standpoint     General Comments General comments (skin integrity, edema, etc.): discussed D/C disposition thoroughly with pt, caregiver and family; educated on technique to (A) and guard pt with mobility to reduce fall risk; recommend use of gt belt for (A)  pt with mobility; caregiver and family verbalized understanding     Exercises General Exercises - Lower Extremity Ankle Circles/Pumps: AROM;Both;10 reps;Seated      Assessment/Plan    PT Assessment Patient needs continued PT services  PT Diagnosis  Difficulty walking;Acute pain;Generalized weakness   PT Problem List Decreased strength;Decreased range of motion;Decreased activity tolerance;Decreased balance;Decreased mobility;Decreased knowledge of use of DME;Decreased safety awareness;Decreased knowledge of precautions;Pain  PT Treatment Interventions DME instruction;Gait training;Functional mobility training;Therapeutic activities;Therapeutic exercise;Balance training;Neuromuscular re-education;Patient/family education   PT Goals (Current goals can be found in the Care Plan section) Acute Rehab PT Goals Patient Stated Goal: to be walking better than this PT Goal Formulation: With patient/family Time For Goal Achievement: 10/01/13 Potential to Achieve Goals: Good    Frequency Min 3X/week   Barriers to discharge        Co-evaluation               End of Session Equipment Utilized During Treatment: Gait belt;Right knee immobilizer Activity Tolerance: Patient tolerated treatment well Patient left: in chair;with chair alarm set;with family/visitor present Nurse Communication: Mobility status;Precautions;Weight bearing status         Time: [id number] PT Time Calculation (min): 23 min   Charges:   PT Evaluation $Initial PT Evaluation Tier I: 1 Procedure PT Treatments $Gait Training: 8-22 mins   PT G CodesDonell Sievert:          Ernestyne Caldwell N, [place]PT  [phone] 09/24/2013, 10:58 AM

## 2013-09-24 NOTE — Progress Notes (Addendum)
OT NOTE  OT evaluation complete. Recommend HHOT for d/c planning. Pt has adequate level of support and adequate level from OT standpoint for d/c home.   09/24/2013 PT [place] calling therapist to inform that plan for d/c has now changed to SNF. Caregivers no longer available 24/7 and will require SNF level care.  Mateo FlowJones, Brynn   OTR/L Pager: [phone] Office: [phone] .

## 2013-09-24 NOTE — Progress Notes (Signed)
OT Cancellation Note  Patient Details Name: Berna Buella Mae Schuknecht MRN: [id number] DOB: 10/18/1918   Cancelled Treatment:    Reason Eval/Treat Not Completed: Patient not medically ready (awaiting ortho consult). Ot to evaluate with new Ortho recommendations.  Harolyn RutherfordJones, Linzi Ohlinger B Pager: [phone]  09/24/2013, 7:57 AM

## 2013-09-24 NOTE — Progress Notes (Signed)
Subjective:  Sitting up in bed, no SOB  Objective:  Vital Signs in the last 24 hours: Temp:  [97.8 F (36.6 C)-98 F (36.7 C)] 97.8 F (36.6 C) (07/01 0523) Pulse Rate:  [71-85] 71 (07/01 0523) Resp:  [18-24] 18 (07/01 0523) BP: (114-147)/(72-86) 114/72 mmHg (07/01 0523) SpO2:  [94 %-97 %] 96 % (07/01 0523) Weight:  [134 lb 3.2 oz (60.873 kg)] 134 lb 3.2 oz (60.873 kg) (07/01 0531)  Intake/Output from previous day:  Intake/Output Summary (Last 24 hours) at 09/24/13 0907 Last data filed at 09/24/13 0531  Gross per 24 hour  Intake    240 ml  Output   1100 ml  Net   -860 ml    Physical Exam: General appearance: alert, cooperative, no distress and HOH Lungs: decreased at bases Heart: irregularly irregular rhythm and 2/6 systolic murmur   Rate: 74  Rhythm: atrial fibrillation  Lab Results:  Recent Labs  09/22/13 1920 09/23/13 0517  WBC 13.3* 9.2  HGB 11.2* 9.0*  PLT 155 118*    Recent Labs  09/23/13 0517 09/24/13 0420  NA 142 144  K 3.2* 4.1  CL 102 104  CO2 25 27  GLUCOSE 110* 93  BUN 21 24*  CREATININE 0.97 1.22*    Recent Labs  09/23/13 0517 09/23/13 1130  TROPONINI <0.30 <0.30    Recent Labs  09/22/13 1920  INR 1.40    Imaging: Imaging results have been reviewed  Cardiac Studies: Echo 09/23/13 Study Conclusions  - Left ventricle: The cavity size was normal. There was moderate concentric hypertrophy. Systolic function was vigorous. The estimated ejection fraction was in the range of 65% to 70%. Wall motion was normal; there were no regional wall motion abnormalities. - Aortic valve: Cusp separation was severely reduced. There was moderate stenosis. There was mild regurgitation. Mean gradient (S): 28 mm Hg. Peak gradient (S): 49 mm Hg. Valve area (VTI): 0.75 cm^2. Valve area (Vmax): 0.69 cm^2. - Aortic root: The aortic root was normal in size. - Mitral valve: Severely calcified annulus. Moderately thickened, severely calcified  leaflets . There was mild regurgitation. Valve area by continuity equation (using LVOT flow): 1.04 cm^2. - Left atrium: The atrium was severely dilated. - Right ventricle: The cavity size was mildly dilated. Wall thickness was normal. - Right atrium: The atrium was severely dilated. - Tricuspid valve: There was moderate-severe regurgitation. - Pulmonary arteries: Systolic pressure was severely increased. PA peak pressure: 76 mm Hg (S).   Assessment/Plan:   Principal Problem:   Acute diastolic heart failure Active Problems:   Anticoagulant-induced bleeding   Left atrial thrombus   Right patella fracture   Acute respiratory failure with hypoxia   CAD (coronary artery disease)   Aortic stenosis, moderate   Atrial fibrillation   HTN (hypertension)   Pulmonary HTN    PLAN: CHF appears compensated, (not sure what brought this on). Conservative Rx planned for her fractured patella. Dr Myrtis SerKatz to see and review anticoagulation issues. She has 24 hr a day help at home.   Corine ShelterLuke Kilroy PA-C Beeper [phone] 09/24/2013, 9:07 AM Patient seen and examined. I agree with the assessment and plan as detailed above. See also my additional thoughts below.   The question concerning anticoagulation is a very difficult one. We know from the past that she has had clots from her heart that have embolized. Despite this, anticoagulation had to be stopped because of life-threatening bleeding. The patient had a falling spell recently. This makes the risk  of anticoagulation even higher. I am very concerned about the CT scan finding relative to her left atrium. However, considering all issues, I feel it is most prudent to not proceed with anticoagulation at this time.  Willa RoughJeffrey Azad Calame, MD, [facility] WilliamsburgFACC 09/24/2013 11:37 AM

## 2013-09-24 NOTE — Progress Notes (Signed)
[facility] PROGRESS NOTE  Assessment/Plan: Acute resp. failure with hypoxia due to acute on chronic diastolic heart failure:: - D/c Rocephin and azithromycin 6.29.2015; as patient is Afebrile and w/o leukocytosis. -patient responded well to diuresis -no SOB today; good air movement, no crackles -will resume demadex at 20mg  daily  Left atrial thrombus - Patient is not on any anticoagulation Despite atrial fibrillation Due to a life-threatening GI bleed in 2013,cause of GI bleed in 2013 was diverticular.. Currently she is DO NOT RESUSCITATE  - consulted cardiology recommended to use ASA -no anticoagulation   Atrial fibrillation - rate controlled. - not on anticoagulation due to h/o life threatening diverticular bleed in the past.3 -given hx of GIB and high risk for falling. Cardiology has recommended for not anticoagulation  Patellar fracture: - Ortho consulted. - continue pain controlled and weight bearing as tolerated -continue knee immobilizer  - Follow up as an outpatient; no intervention planned at this moment.   Code Status: DNR/DNI  Disposition Plan: to be determine; most likely SNF   Consultants:  cardiolog  Orthopedics   Procedures:  Ct angio  Antibiotics:  Rocephin and azithro single dose on 6.29.2015  HPI/Subjective: Reports feeling better and denies CP or SOB. Patient family and caregiver present during evaluation and express concerns about discharge due to inability to be prepared/trained with degree assistance that patient required at this moment.  Objective: Filed Vitals:   09/23/13 2223 09/24/13 0523 09/24/13 0531 09/24/13 0939  BP: 121/86 114/72  111/67  Pulse: 75 71  82  Temp: 97.9 F (36.6 C) 97.8 F (36.6 C)    TempSrc: Oral Oral    Resp: 20 18  18   Height:      Weight:   60.873 kg (134 lb 3.2 oz)   SpO2: 94% 96%  92%    Intake/Output Summary (Last 24 hours) at 09/24/13 1201 Last data filed at 09/24/13 0531  Gross per 24  hour  Intake    240 ml  Output   1100 ml  Net   -860 ml   Filed Weights   09/22/13 2302 09/23/13 0631 09/24/13 0531  Weight: 60 kg (132 lb 4.4 oz) 60.5 kg (133 lb 6.1 oz) 60.873 kg (134 lb 3.2 oz)    Exam:  General: Alert, awake, oriented x3, in no acute distress.  HEENT: No bruits, no goiter. No JVD Heart: rate controlled; positive SEM, no rubs or gallops Lungs: Good air movement, no wheezing; no frank crackles Abdomen: Soft, nontender, nondistended, positive bowel sounds.    Data Reviewed: Basic Metabolic Panel:  Recent Labs Lab 09/22/13 1920 09/23/13 0517 09/24/13 0420  NA 143 142 144  K 3.4* 3.2* 4.1  CL 102 102 104  CO2 23 25 27   GLUCOSE 143* 110* 93  BUN 25* 21 24*  CREATININE 1.09 0.97 1.22*  CALCIUM 9.1 8.7 8.7   Liver Function Tests:  Recent Labs Lab 09/23/13 0517  AST 18  ALT 10  ALKPHOS 101  BILITOT 0.7  PROT 6.0  ALBUMIN 3.2*   CBC:  Recent Labs Lab 09/22/13 1920 09/23/13 0517  WBC 13.3* 9.2  NEUTROABS 11.5* 7.6  HGB 11.2* 9.0*  HCT 33.2* 26.8*  MCV 102.2* 101.5*  PLT 155 118*   Cardiac Enzymes:  Recent Labs Lab 09/22/13 2055 09/23/13 0517 09/23/13 1130  TROPONINI <0.30 <0.30 <0.30   BNP (last 3 results)  Recent Labs  09/22/13 2055  PROBNP 11659.0*    Recent Results (from the past 240 hour(s))  CULTURE,  BLOOD (ROUTINE X 2)     Status: None   Collection Time    09/22/13  8:50 PM      Result Value Ref Range Status   Specimen Description BLOOD ARM RIGHT   Final   Special Requests BOTTLES DRAWN AEROBIC AND ANAEROBIC 10CC EA   Final   Culture  Setup Time     Final   Value: 09/23/2013 03:47     Performed at [facility]   Culture     Final   Value:        BLOOD CULTURE RECEIVED NO GROWTH TO DATE CULTURE WILL BE HELD FOR 5 DAYS BEFORE ISSUING A FINAL NEGATIVE REPORT     Performed at [facility]   Report Status PENDING   Incomplete  CULTURE, BLOOD (ROUTINE X 2)     Status: None   Collection Time     09/22/13  8:55 PM      Result Value Ref Range Status   Specimen Description BLOOD ARM LEFT   Final   Special Requests BOTTLES DRAWN AEROBIC ONLY 10CC   Final   Culture  Setup Time     Final   Value: 09/23/2013 03:46     Performed at [facility]   Culture     Final   Value:        BLOOD CULTURE RECEIVED NO GROWTH TO DATE CULTURE WILL BE HELD FOR 5 DAYS BEFORE ISSUING A FINAL NEGATIVE REPORT     Performed at [facility]   Report Status PENDING   Incomplete     Studies: Ct Angio Chest Pe W/cm &/or Wo Cm  09/22/2013   CLINICAL DATA:  Hypoxemia.  EXAM: CT ANGIOGRAPHY CHEST WITH CONTRAST  TECHNIQUE: Multidetector CT imaging of the chest was performed using the standard protocol during bolus administration of intravenous contrast. Multiplanar CT image reconstructions and MIPs were obtained to evaluate the vascular anatomy.  CONTRAST:  OMNIPAQUE IOHEXOL 350 MG/ML IV.  COMPARISON:  Unenhanced CT chest 04/04/2012.  No prior CTA.  FINDINGS: Contrast opacification of the pulmonary arteries is very good. No filling defects within either main pulmonary artery or their branches in either lung to suggest pulmonary embolism. Very large filling defect involving the left atrial appendage extending into the main portion of the left atrium, though this does not obstruct the pulmonary veins as they enter the atrium. Dense mitral annular calcification. Dense calcification involving the endocardium and myocardium of the left ventricular apex and lateral wall. Moderate aortic valvular calcification. Moderate to severe atherosclerosis involving the thoracic and upper abdominal aorta and their branches without evidence of aneurysm, dissection, or significant stenosis. Replaced proper hepatic artery to the abdominal aorta. Marked right atrial enlargement with reflux of contrast into the intrahepatic IVC and hepatic veins. Severe 3 vessel coronary atherosclerosis. Small pericardial effusion.  Large right  pleural effusion and small left pleural effusion. Passive atelectasis in the lower lobes, right greater than left. Mosaic attenuation in both lungs, with scattered areas of hyperlucency. Mild diffuse interstitial pulmonary edema. No confluent airspace consolidation. No pulmonary parenchymal nodules or masses.  Numerous normal-sized lymph nodes in the mediastinum, hila, and axilla. No significant lymphadenopathy. Visualized thyroid gland normal in appearance.  Visualized upper abdomen unremarkable apart from the findings described above. Bone window images demonstrate diffuse thoracic spondylosis, generalized osseous demineralization, and old fractures of the right lateral sixth, seventh and eighth ribs with incomplete union.  Review of the MIP images confirms the above findings.  IMPRESSION: 1. No evidence of pulmonary embolism. 2. Large thrombus involving the left atrial appendage and the main portion of the left atrium. The thrombus does not occlude the pulmonary veins at this time. 3. Old myocardial infarction involving the apex and lateral wall of the left ventricle with dense calcification. 4. Marked right atrial enlargement. Reflux of contrast into the IVC and hepatic veins is consistent with right heart failure and/or tricuspid regurgitation. 5. Severe 3 vessel coronary atherosclerosis. 6. Small pericardial effusion. 7. Large right pleural effusion and small left pleural effusion. 8. Mild CHF, with mild diffuse interstitial pulmonary edema. These results were called by telephone at the time of interpretation on 09/22/2013 at 9:06 PM to Dr. Rochele RaringKRISTEN WARD , who verbally acknowledged these results.   Electronically Signed   By: Hulan Saashomas  Lawrence M.D.   On: 09/22/2013 21:06   Dg Chest Portable 1 View  09/22/2013   CLINICAL DATA:  Shortness of breath  EXAM: PORTABLE CHEST - 1 VIEW  COMPARISON:  April 27, 2010  FINDINGS: The heart size is enlarged. Mitral annular calcifications are noted. The mediastinum is  stable. The aorta is tortuous. There is consolidation of the medial right lung base. There is a small right pleural effusion. There is no pulmonary edema. No acute abnormalities identified within the visualized bones.  IMPRESSION: Right lung base pneumonia with small right pleural effusion.   Electronically Signed   By: Sherian ReinWei-Chen  Lin M.D.   On: 09/22/2013 19:59   Dg Knee Right Port  09/22/2013   CLINICAL DATA:  Larey SeatFell and injured right knee. Anterior and lateral pain and swelling.  EXAM: PORTABLE RIGHT KNEE - 1-2 VIEW  COMPARISON:  None.  FINDINGS: Comminuted fracture involving the patella. No other visible fractures. Mild medial and lateral compartment joint space narrowing. Chondrocalcinosis involving the lateral and medial menisci. Large joint effusion/hemarthrosis. Osseous demineralization. Femoropopliteal and tibioperoneal atherosclerosis.  IMPRESSION: 1. Comminuted fracture involving the patella. 2. Mild degenerative changes in the medial and lateral compartments secondary to CPPD. 3. Large joint effusion/hemarthrosis.  Sign report   Electronically Signed   By: Hulan Saashomas  Lawrence M.D.   On: 09/22/2013 20:00    Scheduled Meds: . aspirin EC  81 mg Oral Daily  . atorvastatin  10 mg Oral q1800  . diltiazem  240 mg Oral Daily  . furosemide  40 mg Intravenous Q12H  . heparin  5,000 Units Subcutaneous 3 times per day  . metoprolol  50 mg Oral BID  . polyethylene glycol  17 g Oral Daily  . potassium chloride  10 mEq Oral Daily   Continuous Infusions:   Time: < 30 minutes  Vassie LollMadera, Naim Murtha  [facility] Pager [phone]. If 8PM-8AM, please contact night-coverage at [email or web address], password [email or web address] 09/24/2013, 12:01 PM  LOS: 2 days      **Disclaimer: This note may have been dictated with voice recognition software. Similar sounding words can inadvertently be transcribed and this note may contain transcription errors which may not have been corrected upon publication of note.**

## 2013-09-25 ENCOUNTER — Other Ambulatory Visit: Payer: Self-pay

## 2013-09-25 ENCOUNTER — Encounter (HOSPITAL_COMMUNITY)
Admission: EM | Disposition: A | Discharge status: Skilled Nursing Facility | Payer: Self-pay | Source: Home / Self Care | Attending: Internal Medicine

## 2013-09-25 LAB — BASIC METABOLIC PANEL
Anion gap: 15 (ref 5–15)
BUN: 31 mg/dL — ABNORMAL HIGH (ref 6–23)
CO2: 25 mEq/L (ref 19–32)
CREATININE: 1.25 mg/dL — AB (ref 0.50–1.10)
Calcium: 8.8 mg/dL (ref 8.4–10.5)
Chloride: 101 mEq/L (ref 96–112)
GFR calc non Af Amer: 36 mL/min — ABNORMAL LOW (ref >90)
GFR, EST AFRICAN AMERICAN: 42 mL/min — AB (ref >90)
GLUCOSE: 99 mg/dL (ref 70–99)
Potassium: 3.8 mEq/L (ref 3.7–5.3)
Sodium: 141 mEq/L (ref 137–147)

## 2013-09-25 SURGERY — OPEN REDUCTION INTERNAL FIXATION (ORIF) PATELLA
Anesthesia: General | Laterality: Right

## 2013-09-25 MED ORDER — TORSEMIDE 20 MG PO TABS: 20.0000 mg | ORAL_TABLET | Freq: Every day | ORAL | Status: AC

## 2013-09-25 MED ORDER — ENSURE PUDDING PO PUDG: 1.0000 | ORAL | Status: AC

## 2013-09-25 MED ORDER — POLYETHYLENE GLYCOL 3350 17 G PO PACK: 17.0000 g | PACK | Freq: Every day | ORAL | Status: AC

## 2013-09-25 MED ORDER — HYDROCODONE-ACETAMINOPHEN 5-325 MG PO TABS: 1.0000 | ORAL_TABLET | Freq: Four times a day (QID) | ORAL | Status: AC | PRN

## 2013-09-25 MED ORDER — ALPRAZOLAM 0.5 MG PO TABS: 0.5000 mg | ORAL_TABLET | Freq: Three times a day (TID) | ORAL | Status: AC | PRN

## 2013-09-25 NOTE — Plan of Care (Signed)
Problem: Phase I Progression Outcomes Goal: EF % per last Echo/documented,Core Reminder form on chart Outcome: Completed/Met Date Met:  09/25/13 EF performed on 09/23/2013 and EF% is 65-70%

## 2013-09-25 NOTE — Progress Notes (Signed)
1230  Telephone  Report  Given to NewhallJohanna ,Control and instrumentation engineerN  [facility]

## 2013-09-25 NOTE — Progress Notes (Signed)
Patient Name: [name] Date of Encounter: 09/25/2013     Principal Problem:   Acute diastolic heart failure Active Problems:   CAD (coronary artery disease)   Aortic stenosis, moderate   HTN (hypertension)   Pulmonary HTN   Atrial fibrillation   Anticoagulant-induced bleeding   Left atrial thrombus   Right patella fracture   Acute respiratory failure with hypoxia    SUBJECTIVE  Doing well. Denies any chest pain or SOB. States her R leg is itching after placed in binder for patellar fx  CURRENT MEDS . aspirin EC  81 mg Oral Daily  . atorvastatin  10 mg Oral q1800  . diltiazem  240 mg Oral Daily  . feeding supplement (ENSURE)  1 Container Oral Q24H  . heparin  5,000 Units Subcutaneous 3 times per day  . metoprolol  50 mg Oral BID  . polyethylene glycol  17 g Oral Daily  . potassium chloride  10 mEq Oral Daily  . torsemide  20 mg Oral Daily    OBJECTIVE  Filed Vitals:   09/24/13 1400 09/24/13 1700 09/24/13 2129 09/25/13 0440  BP: 115/66 118/59 109/58 110/61  Pulse: 78 80 71 56  Temp: 97.6 F (36.4 C) 97.7 F (36.5 C) 98 F (36.7 C) 97.6 F (36.4 C)  TempSrc: Oral Oral Oral Oral  Resp: 18 20 18 18   Height:      Weight:    133 lb 1.6 oz (60.374 kg)  SpO2: 94% 92% 94% 96%    Intake/Output Summary (Last 24 hours) at 09/25/13 0858 Last data filed at 09/25/13 0636  Gross per 24 hour  Intake    840 ml  Output   1576 ml  Net   -736 ml   Filed Weights   09/23/13 0631 09/24/13 0531 09/25/13 0440  Weight: 133 lb 6.1 oz (60.5 kg) 134 lb 3.2 oz (60.873 kg) 133 lb 1.6 oz (60.374 kg)    PHYSICAL EXAM  General: Pleasant, NAD. Neuro: Alert and oriented X 3. Moves all extremities spontaneously. Psych: Normal affect. HEENT:  Normal  Neck: Supple without bruits or JVD. Lungs:  Resp regular and unlabored, mild decreased air movement in bilateral bases, intermittent rale in bases, however not consistent, otherwise CTA Heart: RRR no s3, s4, or 2/6 systolic  murmur Abdomen: Soft, non-tender, non-distended, BS + x 4.  Extremities: No clubbing, cyanosis or edema. DP/PT/Radials 2+ and equal bilaterally. R knee in binder  Accessory Clinical Findings  CBC  Recent Labs  09/22/13 1920 09/23/13 0517  WBC 13.3* 9.2  NEUTROABS 11.5* 7.6  HGB 11.2* 9.0*  HCT 33.2* 26.8*  MCV 102.2* 101.5*  PLT 155 118*   Basic Metabolic Panel  Recent Labs  09/24/13 0420 09/25/13 0353  NA 144 141  K 4.1 3.8  CL 104 101  CO2 27 25  GLUCOSE 93 99  BUN 24* 31*  CREATININE 1.22* 1.25*  CALCIUM 8.7 8.8   Liver Function Tests  Recent Labs  09/23/13 0517  AST 18  ALT 10  ALKPHOS 101  BILITOT 0.7  PROT 6.0  ALBUMIN 3.2*   No results found for this basename: LIPASE, AMYLASE,  in the last 72 hours Cardiac Enzymes  Recent Labs  09/22/13 2055 09/23/13 0517 09/23/13 1130  TROPONINI <0.30 <0.30 <0.30    TELE  Telemetry broken, unable to move mouse, problem being fixed  ECG  No recent ekg  Radiology/Studies  Ct Angio Chest Pe W/cm &/or Wo Cm  09/22/2013   CLINICAL  DATA:  Hypoxemia.  EXAM: CT ANGIOGRAPHY CHEST WITH CONTRAST  TECHNIQUE: Multidetector CT imaging of the chest was performed using the standard protocol during bolus administration of intravenous contrast. Multiplanar CT image reconstructions and MIPs were obtained to evaluate the vascular anatomy.  CONTRAST:  100mL OMNIPAQUE IOHEXOL 350 MG/ML IV.  COMPARISON:  Unenhanced CT chest 04/04/2012.  No prior CTA.  FINDINGS: Contrast opacification of the pulmonary arteries is very good. No filling defects within either main pulmonary artery or their branches in either lung to suggest pulmonary embolism. Very large filling defect involving the left atrial appendage extending into the main portion of the left atrium, though this does not obstruct the pulmonary veins as they enter the atrium. Dense mitral annular calcification. Dense calcification involving the endocardium and myocardium of the  left ventricular apex and lateral wall. Moderate aortic valvular calcification. Moderate to severe atherosclerosis involving the thoracic and upper abdominal aorta and their branches without evidence of aneurysm, dissection, or significant stenosis. Replaced proper hepatic artery to the abdominal aorta. Marked right atrial enlargement with reflux of contrast into the intrahepatic IVC and hepatic veins. Severe 3 vessel coronary atherosclerosis. Small pericardial effusion.  Large right pleural effusion and small left pleural effusion. Passive atelectasis in the lower lobes, right greater than left. Mosaic attenuation in both lungs, with scattered areas of hyperlucency. Mild diffuse interstitial pulmonary edema. No confluent airspace consolidation. No pulmonary parenchymal nodules or masses.  Numerous normal-sized lymph nodes in the mediastinum, hila, and axilla. No significant lymphadenopathy. Visualized thyroid gland normal in appearance.  Visualized upper abdomen unremarkable apart from the findings described above. Bone window images demonstrate diffuse thoracic spondylosis, generalized osseous demineralization, and old fractures of the right lateral sixth, seventh and eighth ribs with incomplete union.  Review of the MIP images confirms the above findings.  IMPRESSION: 1. No evidence of pulmonary embolism. 2. Large thrombus involving the left atrial appendage and the main portion of the left atrium. The thrombus does not occlude the pulmonary veins at this time. 3. Old myocardial infarction involving the apex and lateral wall of the left ventricle with dense calcification. 4. Marked right atrial enlargement. Reflux of contrast into the IVC and hepatic veins is consistent with right heart failure and/or tricuspid regurgitation. 5. Severe 3 vessel coronary atherosclerosis. 6. Small pericardial effusion. 7. Large right pleural effusion and small left pleural effusion. 8. Mild CHF, with mild diffuse interstitial  pulmonary edema. These results were called by telephone at the time of interpretation on 09/22/2013 at 9:06 PM to Dr. Rochele RaringKRISTEN WARD , who verbally acknowledged these results.   Electronically Signed   By: Hulan Saashomas  Lawrence M.D.   On: 09/22/2013 21:06   Dg Chest Portable 1 View  09/22/2013   CLINICAL DATA:  Shortness of breath  EXAM: PORTABLE CHEST - 1 VIEW  COMPARISON:  April 27, 2010  FINDINGS: The heart size is enlarged. Mitral annular calcifications are noted. The mediastinum is stable. The aorta is tortuous. There is consolidation of the medial right lung base. There is a small right pleural effusion. There is no pulmonary edema. No acute abnormalities identified within the visualized bones.  IMPRESSION: Right lung base pneumonia with small right pleural effusion.   Electronically Signed   By: Sherian ReinWei-Chen  Lin M.D.   On: 09/22/2013 19:59   Dg Knee Right Port  09/22/2013   CLINICAL DATA:  Larey SeatFell and injured right knee. Anterior and lateral pain and swelling.  EXAM: PORTABLE RIGHT KNEE - 1-2 VIEW  COMPARISON:  None.  FINDINGS: Comminuted fracture involving the patella. No other visible fractures. Mild medial and lateral compartment joint space narrowing. Chondrocalcinosis involving the lateral and medial menisci. Large joint effusion/hemarthrosis. Osseous demineralization. Femoropopliteal and tibioperoneal atherosclerosis.  IMPRESSION: 1. Comminuted fracture involving the patella. 2. Mild degenerative changes in the medial and lateral compartments secondary to CPPD. 3. Large joint effusion/hemarthrosis.  Sign report   Electronically Signed   By: Hulan Saas M.D.   On: 09/22/2013 20:00    ASSESSMENT AND PLAN  1. Acute on chronic diastolic HF  - Echo 6/30 EF 65-70%, no regional wall motion, moderate? Stenosis, valve area 0.75, severely dilated biatria, moderate to severe TR, PA peak pressure  - currently compensated, significant valvular problem may have exacerbated the sx, advanced age prohibit  further intervention  - continue diuretic  - no further recommendations  - stable from cardiac perspective  2. LA thrombus   - no anticoagulation given h/o severe GI bleed 2013   3. A-fib  - no anticoagulation per above  - continue rate control medications with Diltiazem and metoprolol  - if HR well controlled on outpt follow up, may cut back on one of the medication to avoid hypotension in this 80 yo female  4. Patellar fx  - further recommendation by ortho  Signed, Azalee Course PA-C Pager: [phone] Patient seen and examined. I agree with the assessment and plan as detailed above. See also my additional thoughts below.   I outlined in my note yesterday the rationale for not using anticoagulation at this time.  Willa Rough, MD, [facility] 09/25/2013 11:14 AM

## 2013-09-25 NOTE — Discharge Summary (Signed)
Physician Discharge Summary  [name] WUJ:[id number] DOB: 08/27/1918 DOA: 09/22/2013  PCP: Abigail Miyamoto, MD  Admit date: 09/22/2013 Discharge date: 09/25/2013  Time spent: >30 minutes  Recommendations for Outpatient Follow-up:  1. BMET to follow electrolytes and renal function 2. CBC to follow Hgb trend   BNP    Component Value Date/Time   PROBNP 11659.0* 09/22/2013 2055   Filed Weights   09/23/13 0631 09/24/13 0531 09/25/13 0440  Weight: 60.5 kg (133 lb 6.1 oz) 60.873 kg (134 lb 3.2 oz) 60.374 kg (133 lb 1.6 oz)     Discharge Diagnoses:  Principal Problem:   Acute diastolic heart failure Active Problems:   CAD (coronary artery disease)   Aortic stenosis, moderate   HTN (hypertension)   Pulmonary HTN   Atrial fibrillation   Anticoagulant-induced bleeding   Left atrial thrombus   Right patella fracture   Acute respiratory failure with hypoxia   Discharge Condition: stable and improved; will discharge to SNF for physical rehabilitation. Will follow with orthopedic service as an outpatient (in 2 weeks). Follow up with PCP in 1 week  Diet recommendation: low sodium diet  History of present illness:  80 y.o. female with Past medical history of [name] is a school aortic stenosis, mitral regurgitation, hypertension, GI bleed, atrial fibrillation not on anticoagulation due to GI bleed, pulmonary hypertension.  Patient presented with complaints of shortness of breath. She mentions this has been an ongoing issue since last a few days.She denies any fever, cough, vomiting, diarrhea. She was on her caudate and fell off the toilet and hit her right knee. No head injury or neck injury reported. She denies any orthopnea or PND. She denies any recent change in her medications. She was found to have significant   Hospital Course:  Acute resp. failure with hypoxia due to acute on chronic diastolic heart failure::  - D/c Rocephin and azithromycin 6.29.2015; as patient is  Afebrile and w/o leukocytosis.  -patient responded well to diuresis  -no SOB today; good air movement, no crackles  -will resume demadex at 20mg  daily   Left atrial thrombus  -Patient is not on any anticoagulation Despite atrial fibrillation Due to a life-threatening GI bleed in 2013,cause of GI bleed in 2013 was diverticular.. Currently she is DO NOT RESUSCITATE  -consulted cardiology recommended to continue use ASA  -no anticoagulation   Atrial fibrillation  - rate controlled.  - not on anticoagulation due to h/o life threatening diverticular bleed in the past and high risk fof fs  -given hx of GIB and high risk for falling. Cardiology has recommended for not anticoagulation   Patellar fracture:  - Ortho consulted and planning conservative management at this point; will see her in 2 weeks as an outpatient - continue pain controlled and weight bearing as tolerated; will discharge to SNF for rehab. -continue knee immobilizer for now.   Procedures:  2-D echo:09/23/13 - Left ventricle: The cavity size was normal. There was moderate concentric hypertrophy. Systolic function was vigorous. The estimated ejection fraction was in the range of 65% to 70%. Wall motion was normal; there were no regional wall motion abnormalities. - Aortic valve: Cusp separation was severely reduced. There was moderate stenosis. There was mild regurgitation. Mean gradient (S): 28 mm Hg. Peak gradient (S): 49 mm Hg. Valve area (VTI): 0.75 cm^2. Valve area (Vmax): 0.69 cm^2. - Aortic root: The aortic root was normal in size. - Mitral valve: Severely calcified annulus. Moderately thickened, severely calcified leaflets . There was  mild regurgitation. Valve area by continuity equation (using LVOT flow): 1.04 cm^2. - Left atrium: The atrium was severely dilated. - Right ventricle: The cavity size was mildly dilated. Wall thickness was normal. - Right atrium: The atrium was severely dilated. - Tricuspid valve:  There was moderate-severe regurgitation. - Pulmonary arteries: Systolic pressure was severely increased. PA peak pressure: 76 mm Hg (S).   Consultations:  Cardiology  Orthopedics  Discharge Exam: Filed Vitals:   09/25/13 0440  BP: 110/61  Pulse: 56  Temp: 97.6 F (36.4 C)  Resp: 18   General: Alert, awake, oriented x3, in no acute distress.  HEENT: No bruits, no goiter. No JVD  Heart: rate controlled; positive SEM, no rubs or gallops  Lungs: Good air movement, no wheezing; no frank crackles  Abdomen: Soft, nontender, nondistended, positive bowel sounds.    Discharge Instructions  Discharge Instructions   Diet - low sodium heart healthy    Complete by:  As directed      Discharge instructions    Complete by:  As directed   Follow a heart healthy diet (low sodium, less than 2 Grams daily) Take medications as prescribed  Maintain good level of hydration (but restrict to no more than 2L daily) Arrange follow up with PCP in 1 week and with cardiology as an outpatient (call office to set up appointment) Weight bearing as tolerated on her right leg; follow up with Dr. [name] in 2 weeks.            Medication List         ALPRAZolam 0.5 MG tablet  Commonly known as:  XANAX  Take 1 tablet (0.5 mg total) by mouth 3 (three) times daily as needed for anxiety.     AMBIEN 5 MG tablet  Generic drug:  zolpidem  Take 5 mg by mouth at bedtime as needed.     aspirin 81 MG tablet  Take 81 mg by mouth daily.     atorvastatin 10 MG tablet  Commonly known as:  LIPITOR  TAKE 1 TABLET BY MOUTH EVERY DAY     cyanocobalamin 1000 MCG/ML injection  Commonly known as:  (VITAMIN B-12)  Inject 1,000 mcg into the muscle every 30 (thirty) days.     diltiazem 240 MG 24 hr capsule  Commonly known as:  CARDIZEM CD  TAKE ONE CAPSULE BY MOUTH EVERY DAY     feeding supplement (ENSURE) Pudg  Take 1 Container by mouth daily.     HYDROcodone-acetaminophen 5-325 MG per tablet  Commonly  known as:  NORCO/VICODIN  Take 1-2 tablets by mouth every 6 (six) hours as needed for moderate pain.     metoprolol 50 MG tablet  Commonly known as:  LOPRESSOR  TAKE 1 TABLET BY MOUTH TWICE A DAY     polyethylene glycol packet  Commonly known as:  MIRALAX / GLYCOLAX  Take 17 g by mouth daily. Hold for diarrhea     potassium chloride 10 MEQ tablet  Commonly known as:  K-DUR  Take 1 tablet (10 mEq total) by mouth daily.     torsemide 20 MG tablet  Commonly known as:  DEMADEX  Take 1 tablet (20 mg total) by mouth daily.       Allergies  Allergen Reactions  . Penicillins Other (See Comments)    unknown  . Sulfa Drugs Cross Reactors Other (See Comments)    Unknown        Follow-up Information   Follow up with PERRY,LAWRENCE EDWARD,  MD In 1 week.   Specialty:  Family Medicine   Contact information:   [address] [place] [address]       Follow up with Margarita Rana, D, MD In 2 weeks.   Specialty:  Orthopedic Surgery   Contact information:   [address] [place] [address] [phone]        The results of significant diagnostics from this hospitalization (including imaging, microbiology, ancillary and laboratory) are listed below for reference.    Significant Diagnostic Studies: Ct Angio Chest Pe W/cm &/or Wo Cm  09/22/2013   CLINICAL DATA:  Hypoxemia.  EXAM: CT ANGIOGRAPHY CHEST WITH CONTRAST  TECHNIQUE: Multidetector CT imaging of the chest was performed using the standard protocol during bolus administration of intravenous contrast. Multiplanar CT image reconstructions and MIPs were obtained to evaluate the vascular anatomy.  CONTRAST:  OMNIPAQUE IOHEXOL 350 MG/ML IV.  COMPARISON:  Unenhanced CT chest 04/04/2012.  No prior CTA.  FINDINGS: Contrast opacification of the pulmonary arteries is very good. No filling defects within either main pulmonary artery or their branches in either lung to suggest pulmonary embolism. Very large filling  defect involving the left atrial appendage extending into the main portion of the left atrium, though this does not obstruct the pulmonary veins as they enter the atrium. Dense mitral annular calcification. Dense calcification involving the endocardium and myocardium of the left ventricular apex and lateral wall. Moderate aortic valvular calcification. Moderate to severe atherosclerosis involving the thoracic and upper abdominal aorta and their branches without evidence of aneurysm, dissection, or significant stenosis. Replaced proper hepatic artery to the abdominal aorta. Marked right atrial enlargement with reflux of contrast into the intrahepatic IVC and hepatic veins. Severe 3 vessel coronary atherosclerosis. Small pericardial effusion.  Large right pleural effusion and small left pleural effusion. Passive atelectasis in the lower lobes, right greater than left. Mosaic attenuation in both lungs, with scattered areas of hyperlucency. Mild diffuse interstitial pulmonary edema. No confluent airspace consolidation. No pulmonary parenchymal nodules or masses.  Numerous normal-sized lymph nodes in the mediastinum, hila, and axilla. No significant lymphadenopathy. Visualized thyroid gland normal in appearance.  Visualized upper abdomen unremarkable apart from the findings described above. Bone window images demonstrate diffuse thoracic spondylosis, generalized osseous demineralization, and old fractures of the right lateral sixth, seventh and eighth ribs with incomplete union.  Review of the MIP images confirms the above findings.  IMPRESSION: 1. No evidence of pulmonary embolism. 2. Large thrombus involving the left atrial appendage and the main portion of the left atrium. The thrombus does not occlude the pulmonary veins at this time. 3. Old myocardial infarction involving the apex and lateral wall of the left ventricle with dense calcification. 4. Marked right atrial enlargement. Reflux of contrast into the IVC and  hepatic veins is consistent with right heart failure and/or tricuspid regurgitation. 5. Severe 3 vessel coronary atherosclerosis. 6. Small pericardial effusion. 7. Large right pleural effusion and small left pleural effusion. 8. Mild CHF, with mild diffuse interstitial pulmonary edema. These results were called by telephone at the time of interpretation on 09/22/2013 at 9:06 PM to Dr. Rochele Raring , who verbally acknowledged these results.   Electronically Signed   By: Hulan Saas M.D.   On: 09/22/2013 21:06   Dg Chest Portable 1 View  09/22/2013   CLINICAL DATA:  Shortness of breath  EXAM: PORTABLE CHEST - 1 VIEW  COMPARISON:  April 27, 2010  FINDINGS: The heart size is  enlarged. Mitral annular calcifications are noted. The mediastinum is stable. The aorta is tortuous. There is consolidation of the medial right lung base. There is a small right pleural effusion. There is no pulmonary edema. No acute abnormalities identified within the visualized bones.  IMPRESSION: Right lung base pneumonia with small right pleural effusion.   Electronically Signed   By: Sherian Rein M.D.   On: 09/22/2013 19:59   Dg Knee Right Port  09/22/2013   CLINICAL DATA:  Larey Seat and injured right knee. Anterior and lateral pain and swelling.  EXAM: PORTABLE RIGHT KNEE - 1-2 VIEW  COMPARISON:  None.  FINDINGS: Comminuted fracture involving the patella. No other visible fractures. Mild medial and lateral compartment joint space narrowing. Chondrocalcinosis involving the lateral and medial menisci. Large joint effusion/hemarthrosis. Osseous demineralization. Femoropopliteal and tibioperoneal atherosclerosis.  IMPRESSION: 1. Comminuted fracture involving the patella. 2. Mild degenerative changes in the medial and lateral compartments secondary to CPPD. 3. Large joint effusion/hemarthrosis.  Sign report   Electronically Signed   By: Hulan Saas M.D.   On: 09/22/2013 20:00    Microbiology: Recent Results (from the past 240  hour(s))  CULTURE, BLOOD (ROUTINE X 2)     Status: None   Collection Time    09/22/13  8:50 PM      Result Value Ref Range Status   Specimen Description BLOOD ARM RIGHT   Final   Special Requests BOTTLES DRAWN AEROBIC AND ANAEROBIC 10CC EA   Final   Culture  Setup Time     Final   Value: 09/23/2013 03:47     Performed at [facility]   Culture     Final   Value:        BLOOD CULTURE RECEIVED NO GROWTH TO DATE CULTURE WILL BE HELD FOR 5 DAYS BEFORE ISSUING A FINAL NEGATIVE REPORT     Performed at [facility]   Report Status PENDING   Incomplete  CULTURE, BLOOD (ROUTINE X 2)     Status: None   Collection Time    09/22/13  8:55 PM      Result Value Ref Range Status   Specimen Description BLOOD ARM LEFT   Final   Special Requests BOTTLES DRAWN AEROBIC ONLY 10CC   Final   Culture  Setup Time     Final   Value: 09/23/2013 03:46     Performed at [facility]   Culture     Final   Value:        BLOOD CULTURE RECEIVED NO GROWTH TO DATE CULTURE WILL BE HELD FOR 5 DAYS BEFORE ISSUING A FINAL NEGATIVE REPORT     Performed at [facility]   Report Status PENDING   Incomplete     Labs: Basic Metabolic Panel:  Recent Labs Lab 09/22/13 1920 09/23/13 0517 09/24/13 0420 09/25/13 0353  NA 143 142 144 141  K 3.4* 3.2* 4.1 3.8  CL 102 102 104 101  CO2 23 25 27 25   GLUCOSE 143* 110* 93 99  BUN 25* 21 24* 31*  CREATININE 1.09 0.97 1.22* 1.25*  CALCIUM 9.1 8.7 8.7 8.8   Liver Function Tests:  Recent Labs Lab 09/23/13 0517  AST 18  ALT 10  ALKPHOS 101  BILITOT 0.7  PROT 6.0  ALBUMIN 3.2*   CBC:  Recent Labs Lab 09/22/13 1920 09/23/13 0517  WBC 13.3* 9.2  NEUTROABS 11.5* 7.6  HGB 11.2* 9.0*  HCT 33.2* 26.8*  MCV 102.2* 101.5*  PLT 155  118*   Cardiac Enzymes:  Recent Labs Lab 09/22/13 2055 09/23/13 0517 09/23/13 1130  TROPONINI <0.30 <0.30 <0.30   BNP: BNP (last 3 results)  Recent Labs  09/22/13 2055  PROBNP 11659.0*     Signed:  Vassie LollMadera, Kiyara Bouffard  [facility] 09/25/2013, 11:36 AM   **Disclaimer: This note may have been dictated with voice recognition software. Similar sounding words can inadvertently be transcribed and this note may contain transcription errors which may not have been corrected upon publication of note.**

## 2013-09-25 NOTE — Progress Notes (Signed)
[facility]

## 2013-09-25 NOTE — Clinical Social Work Placement (Signed)
     Clinical Social Work Department CLINICAL SOCIAL WORK PLACEMENT NOTE 09/25/2013  Patient:  [name],[name]  Account Number:  [id number] Admit date:  09/22/2013  Clinical Social Worker:  Lupita LeashNNA Drexel Ivey, LCSWA  Date/time:  09/25/2013 04:19 PM  Clinical Social Work is seeking post-discharge placement for this patient at the following level of care:   SKILLED NURSING   (*CSW will update this form in Epic as items are completed)   09/25/2013  Patient/family provided with [facility] Department of Clinical Social Works list of facilities offering this level of care within the geographic area requested by the patient (or if unable, by the patients family).  09/25/2013  Patient/family informed of their freedom to choose among providers that offer the needed level of care, that participate in Medicare, Medicaid or managed care program needed by the patient, have an available bed and are willing to accept the patient.  09/25/2013  Patient/family informed of [facility] ownership interest in [facility], as well as of the fact that they are under no obligation to receive care at this facility.  PASARR submitted to EDS on 09/25/2013 PASARR number received on 09/25/2013  FL2 transmitted to all facilities in geographic area requested by pt/family on  09/25/2013 FL2 transmitted to all facilities within larger geographic area on   Patient informed that his/her managed care company has contracts with or will negotiate with  certain facilities, including the following:   NA     Patient/family informed of bed offers received:  09/25/2013 Patient chooses bed at Other Physician recommends and patient chooses bed at    Patient to be transferred to Other on  09/25/2013 Patient to be transferred to facility by [facility] Patient and family notified of transfer on 09/25/2013 Name of family member notified:  Neice:  Theresa DutyVickie Faulkner  The following physician request were entered in  Epic: Physician Request  Please sign FL2.  Please prepare priority discharge summary and prescriptions.    Additional Comments: 09/25/13 OK per MD for d/c today to SNF at [facility][ [place] Patient and neice are very pleased with d/c plan and agreeable to d/c today. Nursing notified to call report. CSW signing off.

## 2013-09-25 NOTE — Clinical Social Work Psychosocial (Signed)
     Clinical Social Work Department BRIEF PSYCHOSOCIAL ASSESSMENT 09/25/2013  Patient:  [name], [name]     Account Number:  [id number]     Admit date:  09/22/2013  Clinical Social Worker:  Iona Coach  Date/Time:  09/25/2013 04:08 PM  Referred by:  Physician  Date Referred:  09/25/2013 Referred for  SNF Placement   Other Referral:   Interview type:  Other - See comment Other interview type:   Pateint, neice- Vickie and sitter Maxine    PSYCHOSOCIAL DATA Living Status:  ALONE Admitted from facility:   Level of care:   Primary support name:  [employer] [place]- [phone] Primary support relationship to patient:  FAMILY Degree of support available:   [place]- private care sitter    CURRENT CONCERNS Current Concerns  Post-Acute Placement   Other Concerns:    SOCIAL WORK ASSESSMENT / PLAN 80 year old female referred to [facility] for short term SNF per PT recommendation. CSW met with pateint, her neice and her private sitter today to discuss d/c options. They request [facility] if possible. Discussed bed search process and their back up plan is [facility].  Fl2 initiated and sent to facilities for review. Bed offers in place and patient/neice accepted a bed at [facility]. Fl2 signed by MD. Madaline Brilliant per MD for d/c today   Assessment/plan status:  No Further Intervention Required Other assessment/ plan:   Information/referral to community resources:   SNF provided    PATIENTS/FAMILYS RESPONSE TO PLAN OF CARE: Patient is alert and oriented; very pleasant lady. She is agreeable to SNF for short term rehab and is pleased to get a bed at the facility of choice. Patient's neice will go sign admission paperwork at the facility. Chart copied and nursing notified to call report to facility. No further CSW needs identified.

## 2013-09-29 ENCOUNTER — Other Ambulatory Visit: Payer: Self-pay | Admitting: *Deleted

## 2013-09-29 LAB — CULTURE, BLOOD (ROUTINE X 2)
CULTURE: NO GROWTH
Culture: NO GROWTH

## 2013-09-29 MED ORDER — ATORVASTATIN CALCIUM 10 MG PO TABS: ORAL_TABLET | ORAL | Status: AC

## 2013-11-22 ENCOUNTER — Other Ambulatory Visit: Payer: Self-pay | Admitting: Cardiology

## 2014-01-06 ENCOUNTER — Other Ambulatory Visit: Payer: Self-pay | Admitting: Cardiology

## 2014-03-27 DEATH — deceased

## 2014-04-14 ENCOUNTER — Other Ambulatory Visit: Payer: Self-pay | Admitting: *Deleted

## 2014-04-14 MED ORDER — METOPROLOL TARTRATE 50 MG PO TABS: 50.0000 mg | ORAL_TABLET | Freq: Two times a day (BID) | ORAL | Status: AC

## 2014-05-25 ENCOUNTER — Telehealth: Payer: Self-pay

## 2014-05-25 NOTE — Telephone Encounter (Signed)
[facility]
